# Patient Record
Sex: Male | Born: 1960 | Race: Black or African American | Hispanic: No | Marital: Single | Smoking: Former smoker
Health system: Southern US, Community
[De-identification: ages and names within clinical notes are randomized; demographics above are authoritative.]

## PROBLEM LIST (undated history)

## (undated) DIAGNOSIS — R112 Nausea with vomiting, unspecified: Secondary | ICD-10-CM

## (undated) DIAGNOSIS — R7303 Prediabetes: Secondary | ICD-10-CM

## (undated) DIAGNOSIS — T8859XA Other complications of anesthesia, initial encounter: Secondary | ICD-10-CM

## (undated) DIAGNOSIS — I10 Essential (primary) hypertension: Secondary | ICD-10-CM

## (undated) DIAGNOSIS — Z86718 Personal history of other venous thrombosis and embolism: Secondary | ICD-10-CM

## (undated) DIAGNOSIS — E785 Hyperlipidemia, unspecified: Secondary | ICD-10-CM

## (undated) DIAGNOSIS — Z9889 Other specified postprocedural states: Secondary | ICD-10-CM

## (undated) DIAGNOSIS — L309 Dermatitis, unspecified: Secondary | ICD-10-CM

## (undated) DIAGNOSIS — M1990 Unspecified osteoarthritis, unspecified site: Secondary | ICD-10-CM

## (undated) DIAGNOSIS — J45909 Unspecified asthma, uncomplicated: Secondary | ICD-10-CM

## (undated) DIAGNOSIS — H509 Unspecified strabismus: Secondary | ICD-10-CM

## (undated) DIAGNOSIS — T4145XA Adverse effect of unspecified anesthetic, initial encounter: Secondary | ICD-10-CM

## (undated) HISTORY — PX: LUMBAR DISC SURGERY: SHX700

---

## 2004-01-28 ENCOUNTER — Ambulatory Visit (HOSPITAL_COMMUNITY): Admission: RE | Admit: 2004-01-28 | Discharge: 2004-01-28 | Payer: Self-pay | Admitting: Internal Medicine

## 2004-02-03 ENCOUNTER — Ambulatory Visit (HOSPITAL_COMMUNITY): Admission: RE | Admit: 2004-02-03 | Discharge: 2004-02-03 | Payer: Self-pay | Admitting: Internal Medicine

## 2004-06-22 ENCOUNTER — Ambulatory Visit: Payer: Self-pay | Admitting: Family Medicine

## 2004-06-28 ENCOUNTER — Ambulatory Visit: Payer: Self-pay | Admitting: *Deleted

## 2004-06-28 ENCOUNTER — Ambulatory Visit: Payer: Self-pay | Admitting: Family Medicine

## 2004-09-14 ENCOUNTER — Ambulatory Visit: Payer: Self-pay | Admitting: Family Medicine

## 2004-09-17 ENCOUNTER — Ambulatory Visit (HOSPITAL_COMMUNITY): Admission: RE | Admit: 2004-09-17 | Discharge: 2004-09-17 | Payer: Self-pay | Admitting: Family Medicine

## 2004-09-17 ENCOUNTER — Ambulatory Visit: Payer: Self-pay | Admitting: Family Medicine

## 2004-09-22 ENCOUNTER — Ambulatory Visit: Payer: Self-pay | Admitting: Family Medicine

## 2005-01-17 ENCOUNTER — Ambulatory Visit (HOSPITAL_COMMUNITY): Admission: RE | Admit: 2005-01-17 | Discharge: 2005-01-17 | Payer: Self-pay | Admitting: Internal Medicine

## 2005-01-17 ENCOUNTER — Ambulatory Visit: Payer: Self-pay | Admitting: Family Medicine

## 2005-01-18 ENCOUNTER — Ambulatory Visit: Payer: Self-pay | Admitting: Family Medicine

## 2005-03-30 ENCOUNTER — Ambulatory Visit: Payer: Self-pay | Admitting: Family Medicine

## 2005-04-04 ENCOUNTER — Ambulatory Visit: Payer: Self-pay | Admitting: Internal Medicine

## 2005-07-27 ENCOUNTER — Emergency Department (HOSPITAL_COMMUNITY): Admission: EM | Admit: 2005-07-27 | Discharge: 2005-07-27 | Payer: Self-pay | Admitting: Emergency Medicine

## 2005-09-01 ENCOUNTER — Ambulatory Visit: Payer: Self-pay | Admitting: Family Medicine

## 2005-09-15 ENCOUNTER — Ambulatory Visit: Payer: Self-pay | Admitting: Family Medicine

## 2005-10-19 ENCOUNTER — Ambulatory Visit: Payer: Self-pay | Admitting: Family Medicine

## 2005-11-26 ENCOUNTER — Ambulatory Visit (HOSPITAL_COMMUNITY): Admission: RE | Admit: 2005-11-26 | Discharge: 2005-11-26 | Payer: Self-pay | Admitting: General Practice

## 2006-02-07 ENCOUNTER — Emergency Department (HOSPITAL_COMMUNITY): Admission: EM | Admit: 2006-02-07 | Discharge: 2006-02-07 | Payer: Self-pay | Admitting: *Deleted

## 2006-02-17 ENCOUNTER — Emergency Department (HOSPITAL_COMMUNITY): Admission: EM | Admit: 2006-02-17 | Discharge: 2006-02-17 | Payer: Self-pay | Admitting: Emergency Medicine

## 2006-02-21 ENCOUNTER — Encounter: Admission: RE | Admit: 2006-02-21 | Discharge: 2006-02-21 | Payer: Self-pay | Admitting: General Practice

## 2006-02-28 ENCOUNTER — Emergency Department (HOSPITAL_COMMUNITY): Admission: EM | Admit: 2006-02-28 | Discharge: 2006-02-28 | Payer: Self-pay | Admitting: Emergency Medicine

## 2006-06-16 ENCOUNTER — Emergency Department (HOSPITAL_COMMUNITY): Admission: EM | Admit: 2006-06-16 | Discharge: 2006-06-16 | Payer: Self-pay | Admitting: Emergency Medicine

## 2006-07-10 ENCOUNTER — Emergency Department (HOSPITAL_COMMUNITY): Admission: EM | Admit: 2006-07-10 | Discharge: 2006-07-10 | Payer: Self-pay | Admitting: Emergency Medicine

## 2006-08-02 ENCOUNTER — Emergency Department (HOSPITAL_COMMUNITY): Admission: EM | Admit: 2006-08-02 | Discharge: 2006-08-02 | Payer: Self-pay | Admitting: Emergency Medicine

## 2006-09-16 ENCOUNTER — Emergency Department (HOSPITAL_COMMUNITY): Admission: EM | Admit: 2006-09-16 | Discharge: 2006-09-16 | Payer: Self-pay | Admitting: Emergency Medicine

## 2006-11-02 ENCOUNTER — Emergency Department (HOSPITAL_COMMUNITY): Admission: EM | Admit: 2006-11-02 | Discharge: 2006-11-02 | Payer: Self-pay | Admitting: Emergency Medicine

## 2006-11-03 ENCOUNTER — Emergency Department (HOSPITAL_COMMUNITY): Admission: EM | Admit: 2006-11-03 | Discharge: 2006-11-03 | Payer: Self-pay | Admitting: Emergency Medicine

## 2006-12-04 ENCOUNTER — Emergency Department (HOSPITAL_COMMUNITY): Admission: EM | Admit: 2006-12-04 | Discharge: 2006-12-04 | Payer: Self-pay | Admitting: Emergency Medicine

## 2006-12-04 ENCOUNTER — Emergency Department (HOSPITAL_COMMUNITY): Admission: EM | Admit: 2006-12-04 | Discharge: 2006-12-04 | Payer: Self-pay | Admitting: Family Medicine

## 2006-12-08 ENCOUNTER — Emergency Department (HOSPITAL_COMMUNITY): Admission: EM | Admit: 2006-12-08 | Discharge: 2006-12-08 | Payer: Self-pay | Admitting: Emergency Medicine

## 2006-12-19 ENCOUNTER — Emergency Department (HOSPITAL_COMMUNITY): Admission: EM | Admit: 2006-12-19 | Discharge: 2006-12-19 | Payer: Self-pay | Admitting: Emergency Medicine

## 2007-01-02 ENCOUNTER — Emergency Department (HOSPITAL_COMMUNITY): Admission: EM | Admit: 2007-01-02 | Discharge: 2007-01-02 | Payer: Self-pay | Admitting: Emergency Medicine

## 2007-01-17 ENCOUNTER — Emergency Department (HOSPITAL_COMMUNITY): Admission: EM | Admit: 2007-01-17 | Discharge: 2007-01-17 | Payer: Self-pay | Admitting: Emergency Medicine

## 2007-05-01 ENCOUNTER — Encounter
Admission: RE | Admit: 2007-05-01 | Discharge: 2007-05-01 | Payer: Self-pay | Admitting: Physical Medicine and Rehabilitation

## 2007-05-07 ENCOUNTER — Emergency Department (HOSPITAL_COMMUNITY): Admission: EM | Admit: 2007-05-07 | Discharge: 2007-05-07 | Payer: Self-pay | Admitting: Emergency Medicine

## 2007-05-30 ENCOUNTER — Emergency Department (HOSPITAL_COMMUNITY): Admission: EM | Admit: 2007-05-30 | Discharge: 2007-05-30 | Payer: Self-pay | Admitting: Emergency Medicine

## 2007-06-13 ENCOUNTER — Emergency Department (HOSPITAL_COMMUNITY): Admission: EM | Admit: 2007-06-13 | Discharge: 2007-06-13 | Payer: Self-pay | Admitting: Emergency Medicine

## 2007-06-28 ENCOUNTER — Emergency Department (HOSPITAL_COMMUNITY): Admission: EM | Admit: 2007-06-28 | Discharge: 2007-06-28 | Payer: Self-pay | Admitting: Family Medicine

## 2007-06-29 DIAGNOSIS — M545 Low back pain, unspecified: Secondary | ICD-10-CM | POA: Insufficient documentation

## 2007-06-29 DIAGNOSIS — I10 Essential (primary) hypertension: Secondary | ICD-10-CM | POA: Insufficient documentation

## 2007-06-29 DIAGNOSIS — J329 Chronic sinusitis, unspecified: Secondary | ICD-10-CM | POA: Insufficient documentation

## 2007-07-30 ENCOUNTER — Ambulatory Visit: Payer: Self-pay | Admitting: Internal Medicine

## 2007-07-30 LAB — CONVERTED CEMR LAB
ALT: 21 units/L (ref 0–53)
AST: 19 units/L (ref 0–37)
Basophils Absolute: 0 10*3/uL (ref 0.0–0.1)
Basophils Relative: 1 % (ref 0–1)
CO2: 25 meq/L (ref 19–32)
Calcium: 9.2 mg/dL (ref 8.4–10.5)
Chloride: 104 meq/L (ref 96–112)
Creatinine, Ser: 1.09 mg/dL (ref 0.40–1.50)
Lymphocytes Relative: 31 % (ref 12–46)
MCHC: 32.9 g/dL (ref 30.0–36.0)
Neutro Abs: 5 10*3/uL (ref 1.7–7.7)
Neutrophils Relative %: 58 % (ref 43–77)
PSA: 1.23 ng/mL (ref 0.10–4.00)
Platelets: 311 10*3/uL (ref 150–400)
Potassium: 4.7 meq/L (ref 3.5–5.3)
RDW: 14.9 % (ref 11.5–15.5)
Sodium: 139 meq/L (ref 135–145)
Total CHOL/HDL Ratio: 4.2
Total Protein: 7.9 g/dL (ref 6.0–8.3)

## 2007-09-05 ENCOUNTER — Emergency Department (HOSPITAL_COMMUNITY): Admission: EM | Admit: 2007-09-05 | Discharge: 2007-09-05 | Payer: Self-pay | Admitting: Emergency Medicine

## 2007-12-06 ENCOUNTER — Ambulatory Visit: Payer: Self-pay | Admitting: Family Medicine

## 2007-12-25 ENCOUNTER — Emergency Department (HOSPITAL_COMMUNITY): Admission: EM | Admit: 2007-12-25 | Discharge: 2007-12-25 | Payer: Self-pay | Admitting: Emergency Medicine

## 2008-01-06 ENCOUNTER — Emergency Department (HOSPITAL_COMMUNITY): Admission: EM | Admit: 2008-01-06 | Discharge: 2008-01-06 | Payer: Self-pay | Admitting: Emergency Medicine

## 2008-01-23 ENCOUNTER — Emergency Department (HOSPITAL_COMMUNITY): Admission: EM | Admit: 2008-01-23 | Discharge: 2008-01-23 | Payer: Self-pay | Admitting: Emergency Medicine

## 2008-02-06 ENCOUNTER — Ambulatory Visit: Payer: Self-pay | Admitting: Internal Medicine

## 2008-02-20 ENCOUNTER — Emergency Department (HOSPITAL_COMMUNITY): Admission: EM | Admit: 2008-02-20 | Discharge: 2008-02-20 | Payer: Self-pay | Admitting: Emergency Medicine

## 2008-03-05 ENCOUNTER — Emergency Department (HOSPITAL_COMMUNITY): Admission: EM | Admit: 2008-03-05 | Discharge: 2008-03-05 | Payer: Self-pay | Admitting: Emergency Medicine

## 2008-05-20 ENCOUNTER — Inpatient Hospital Stay (HOSPITAL_COMMUNITY): Admission: RE | Admit: 2008-05-20 | Discharge: 2008-05-26 | Payer: Self-pay | Admitting: Orthopedic Surgery

## 2008-05-20 ENCOUNTER — Encounter (INDEPENDENT_AMBULATORY_CARE_PROVIDER_SITE_OTHER): Payer: Self-pay | Admitting: Orthopedic Surgery

## 2008-05-30 ENCOUNTER — Encounter (INDEPENDENT_AMBULATORY_CARE_PROVIDER_SITE_OTHER): Payer: Self-pay | Admitting: Orthopedic Surgery

## 2008-05-30 ENCOUNTER — Ambulatory Visit: Payer: Self-pay | Admitting: Vascular Surgery

## 2008-05-30 ENCOUNTER — Inpatient Hospital Stay (HOSPITAL_COMMUNITY): Admission: AD | Admit: 2008-05-30 | Discharge: 2008-06-09 | Payer: Self-pay | Admitting: Orthopedic Surgery

## 2008-05-30 DIAGNOSIS — Z86718 Personal history of other venous thrombosis and embolism: Secondary | ICD-10-CM

## 2008-05-30 HISTORY — DX: Personal history of other venous thrombosis and embolism: Z86.718

## 2008-06-02 ENCOUNTER — Ambulatory Visit: Payer: Self-pay | Admitting: Infectious Diseases

## 2008-06-03 ENCOUNTER — Encounter (INDEPENDENT_AMBULATORY_CARE_PROVIDER_SITE_OTHER): Payer: Self-pay | Admitting: Orthopedic Surgery

## 2008-07-15 ENCOUNTER — Emergency Department (HOSPITAL_COMMUNITY): Admission: EM | Admit: 2008-07-15 | Discharge: 2008-07-15 | Payer: Self-pay | Admitting: Emergency Medicine

## 2008-07-27 ENCOUNTER — Emergency Department (HOSPITAL_COMMUNITY): Admission: EM | Admit: 2008-07-27 | Discharge: 2008-07-27 | Payer: Self-pay | Admitting: Emergency Medicine

## 2008-08-15 ENCOUNTER — Emergency Department (HOSPITAL_COMMUNITY): Admission: EM | Admit: 2008-08-15 | Discharge: 2008-08-15 | Payer: Self-pay | Admitting: Family Medicine

## 2008-08-23 ENCOUNTER — Emergency Department (HOSPITAL_COMMUNITY): Admission: EM | Admit: 2008-08-23 | Discharge: 2008-08-23 | Payer: Self-pay | Admitting: Emergency Medicine

## 2008-09-02 ENCOUNTER — Emergency Department (HOSPITAL_COMMUNITY): Admission: EM | Admit: 2008-09-02 | Discharge: 2008-09-02 | Payer: Self-pay | Admitting: Emergency Medicine

## 2008-09-03 ENCOUNTER — Ambulatory Visit: Payer: Self-pay | Admitting: Internal Medicine

## 2008-09-03 ENCOUNTER — Encounter (INDEPENDENT_AMBULATORY_CARE_PROVIDER_SITE_OTHER): Payer: Self-pay | Admitting: Family Medicine

## 2008-09-03 LAB — CONVERTED CEMR LAB
ALT: 23 units/L (ref 0–53)
Albumin: 4.5 g/dL (ref 3.5–5.2)
Alkaline Phosphatase: 62 units/L (ref 39–117)
CO2: 22 meq/L (ref 19–32)
Cholesterol: 179 mg/dL (ref 0–200)
LDL Cholesterol: 111 mg/dL — ABNORMAL HIGH (ref 0–99)
Potassium: 4.4 meq/L (ref 3.5–5.3)
Sodium: 142 meq/L (ref 135–145)
Total Bilirubin: 0.4 mg/dL (ref 0.3–1.2)
Total Protein: 7.9 g/dL (ref 6.0–8.3)
VLDL: 28 mg/dL (ref 0–40)

## 2008-09-23 ENCOUNTER — Emergency Department (HOSPITAL_COMMUNITY): Admission: EM | Admit: 2008-09-23 | Discharge: 2008-09-23 | Payer: Self-pay | Admitting: Emergency Medicine

## 2008-10-07 ENCOUNTER — Emergency Department (HOSPITAL_COMMUNITY): Admission: EM | Admit: 2008-10-07 | Discharge: 2008-10-07 | Payer: Self-pay | Admitting: Emergency Medicine

## 2008-10-17 ENCOUNTER — Emergency Department (HOSPITAL_COMMUNITY): Admission: EM | Admit: 2008-10-17 | Discharge: 2008-10-18 | Payer: Self-pay | Admitting: Emergency Medicine

## 2008-11-01 ENCOUNTER — Emergency Department (HOSPITAL_COMMUNITY): Admission: EM | Admit: 2008-11-01 | Discharge: 2008-11-01 | Payer: Self-pay | Admitting: Emergency Medicine

## 2008-11-03 ENCOUNTER — Emergency Department (HOSPITAL_COMMUNITY): Admission: EM | Admit: 2008-11-03 | Discharge: 2008-11-03 | Payer: Self-pay | Admitting: Family Medicine

## 2008-11-16 ENCOUNTER — Emergency Department (HOSPITAL_COMMUNITY): Admission: EM | Admit: 2008-11-16 | Discharge: 2008-11-16 | Payer: Self-pay | Admitting: Emergency Medicine

## 2008-12-14 ENCOUNTER — Emergency Department (HOSPITAL_COMMUNITY): Admission: EM | Admit: 2008-12-14 | Discharge: 2008-12-14 | Payer: Self-pay | Admitting: Emergency Medicine

## 2008-12-28 ENCOUNTER — Emergency Department (HOSPITAL_COMMUNITY): Admission: EM | Admit: 2008-12-28 | Discharge: 2008-12-28 | Payer: Self-pay | Admitting: Emergency Medicine

## 2009-01-18 ENCOUNTER — Emergency Department (HOSPITAL_COMMUNITY): Admission: EM | Admit: 2009-01-18 | Discharge: 2009-01-19 | Payer: Self-pay | Admitting: Emergency Medicine

## 2009-01-21 ENCOUNTER — Emergency Department (HOSPITAL_COMMUNITY): Admission: EM | Admit: 2009-01-21 | Discharge: 2009-01-21 | Payer: Self-pay | Admitting: Emergency Medicine

## 2009-01-23 ENCOUNTER — Ambulatory Visit: Payer: Self-pay | Admitting: Internal Medicine

## 2009-02-07 ENCOUNTER — Emergency Department (HOSPITAL_COMMUNITY): Admission: EM | Admit: 2009-02-07 | Discharge: 2009-02-08 | Payer: Self-pay | Admitting: Emergency Medicine

## 2009-02-19 ENCOUNTER — Emergency Department (HOSPITAL_COMMUNITY): Admission: EM | Admit: 2009-02-19 | Discharge: 2009-02-19 | Payer: Self-pay | Admitting: Emergency Medicine

## 2009-02-26 ENCOUNTER — Emergency Department (HOSPITAL_COMMUNITY): Admission: EM | Admit: 2009-02-26 | Discharge: 2009-02-26 | Payer: Self-pay | Admitting: Emergency Medicine

## 2009-03-02 ENCOUNTER — Emergency Department (HOSPITAL_COMMUNITY): Admission: EM | Admit: 2009-03-02 | Discharge: 2009-03-02 | Payer: Self-pay | Admitting: Emergency Medicine

## 2009-03-31 ENCOUNTER — Emergency Department (HOSPITAL_COMMUNITY): Admission: EM | Admit: 2009-03-31 | Discharge: 2009-03-31 | Payer: Self-pay | Admitting: Emergency Medicine

## 2009-04-17 ENCOUNTER — Emergency Department (HOSPITAL_COMMUNITY): Admission: EM | Admit: 2009-04-17 | Discharge: 2009-04-18 | Payer: Self-pay | Admitting: Emergency Medicine

## 2009-05-25 ENCOUNTER — Encounter: Admission: RE | Admit: 2009-05-25 | Discharge: 2009-05-25 | Payer: Self-pay | Admitting: Family Medicine

## 2009-09-11 ENCOUNTER — Emergency Department (HOSPITAL_COMMUNITY): Admission: EM | Admit: 2009-09-11 | Discharge: 2009-09-11 | Payer: Self-pay | Admitting: Emergency Medicine

## 2009-09-26 ENCOUNTER — Emergency Department (HOSPITAL_COMMUNITY): Admission: EM | Admit: 2009-09-26 | Discharge: 2009-09-26 | Payer: Self-pay | Admitting: Emergency Medicine

## 2009-10-08 ENCOUNTER — Emergency Department (HOSPITAL_COMMUNITY): Admission: EM | Admit: 2009-10-08 | Discharge: 2009-10-08 | Payer: Self-pay | Admitting: Emergency Medicine

## 2009-10-20 ENCOUNTER — Emergency Department (HOSPITAL_COMMUNITY): Admission: EM | Admit: 2009-10-20 | Discharge: 2009-10-20 | Payer: Self-pay | Admitting: Emergency Medicine

## 2009-11-06 ENCOUNTER — Emergency Department (HOSPITAL_COMMUNITY): Admission: EM | Admit: 2009-11-06 | Discharge: 2009-11-06 | Payer: Self-pay | Admitting: Emergency Medicine

## 2009-11-18 ENCOUNTER — Emergency Department (HOSPITAL_COMMUNITY): Admission: EM | Admit: 2009-11-18 | Discharge: 2009-11-18 | Payer: Self-pay | Admitting: Emergency Medicine

## 2009-11-28 ENCOUNTER — Emergency Department (HOSPITAL_COMMUNITY): Admission: EM | Admit: 2009-11-28 | Discharge: 2009-11-28 | Payer: Self-pay | Admitting: Emergency Medicine

## 2009-11-29 ENCOUNTER — Emergency Department (HOSPITAL_COMMUNITY): Admission: EM | Admit: 2009-11-29 | Discharge: 2009-11-29 | Payer: Self-pay | Admitting: Emergency Medicine

## 2009-12-01 ENCOUNTER — Emergency Department (HOSPITAL_COMMUNITY): Admission: EM | Admit: 2009-12-01 | Discharge: 2009-12-01 | Payer: Self-pay | Admitting: Emergency Medicine

## 2010-01-10 ENCOUNTER — Emergency Department (HOSPITAL_COMMUNITY): Admission: EM | Admit: 2010-01-10 | Discharge: 2010-01-10 | Payer: Self-pay | Admitting: Emergency Medicine

## 2010-01-30 ENCOUNTER — Emergency Department (HOSPITAL_COMMUNITY): Admission: EM | Admit: 2010-01-30 | Discharge: 2010-01-30 | Payer: Self-pay | Admitting: Emergency Medicine

## 2010-04-28 ENCOUNTER — Emergency Department (HOSPITAL_COMMUNITY): Admission: EM | Admit: 2010-04-28 | Discharge: 2010-04-28 | Payer: Self-pay | Admitting: Emergency Medicine

## 2010-06-03 ENCOUNTER — Encounter
Admission: RE | Admit: 2010-06-03 | Discharge: 2010-06-07 | Payer: Self-pay | Source: Home / Self Care | Attending: Physical Medicine & Rehabilitation | Admitting: Physical Medicine & Rehabilitation

## 2010-06-07 ENCOUNTER — Ambulatory Visit: Payer: Self-pay | Admitting: Physical Medicine & Rehabilitation

## 2010-08-05 ENCOUNTER — Emergency Department (HOSPITAL_COMMUNITY): Admission: EM | Admit: 2010-08-05 | Discharge: 2010-01-31 | Payer: Self-pay | Admitting: Emergency Medicine

## 2010-08-05 ENCOUNTER — Emergency Department (HOSPITAL_COMMUNITY)
Admission: EM | Admit: 2010-08-05 | Discharge: 2010-08-05 | Payer: Self-pay | Source: Home / Self Care | Admitting: Emergency Medicine

## 2010-08-13 ENCOUNTER — Ambulatory Visit: Payer: Self-pay | Admitting: Physical Medicine & Rehabilitation

## 2010-10-18 ENCOUNTER — Ambulatory Visit: Payer: Self-pay | Admitting: Physical Medicine & Rehabilitation

## 2010-11-10 ENCOUNTER — Ambulatory Visit: Payer: Medicaid Other | Admitting: Physical Therapy

## 2010-11-13 ENCOUNTER — Emergency Department (INDEPENDENT_AMBULATORY_CARE_PROVIDER_SITE_OTHER): Payer: Medicaid Other

## 2010-11-13 ENCOUNTER — Emergency Department (HOSPITAL_BASED_OUTPATIENT_CLINIC_OR_DEPARTMENT_OTHER)
Admission: EM | Admit: 2010-11-13 | Discharge: 2010-11-13 | Disposition: A | Discharge status: Home / Self Care | Payer: Medicaid Other | Attending: Emergency Medicine | Admitting: Emergency Medicine

## 2010-11-13 DIAGNOSIS — Y92410 Unspecified street and highway as the place of occurrence of the external cause: Secondary | ICD-10-CM | POA: Insufficient documentation

## 2010-11-13 DIAGNOSIS — I10 Essential (primary) hypertension: Secondary | ICD-10-CM | POA: Insufficient documentation

## 2010-11-13 DIAGNOSIS — E785 Hyperlipidemia, unspecified: Secondary | ICD-10-CM | POA: Insufficient documentation

## 2010-11-13 DIAGNOSIS — J45909 Unspecified asthma, uncomplicated: Secondary | ICD-10-CM | POA: Insufficient documentation

## 2010-11-13 DIAGNOSIS — M545 Low back pain, unspecified: Secondary | ICD-10-CM

## 2010-11-13 DIAGNOSIS — S335XXA Sprain of ligaments of lumbar spine, initial encounter: Secondary | ICD-10-CM | POA: Insufficient documentation

## 2010-11-13 DIAGNOSIS — M542 Cervicalgia: Secondary | ICD-10-CM

## 2010-11-13 DIAGNOSIS — S139XXA Sprain of joints and ligaments of unspecified parts of neck, initial encounter: Secondary | ICD-10-CM | POA: Insufficient documentation

## 2010-11-14 ENCOUNTER — Emergency Department (HOSPITAL_COMMUNITY)
Admission: EM | Admit: 2010-11-14 | Discharge: 2010-11-14 | Disposition: A | Discharge status: Home / Self Care | Payer: Medicaid Other | Attending: Emergency Medicine | Admitting: Emergency Medicine

## 2010-11-14 DIAGNOSIS — M79609 Pain in unspecified limb: Secondary | ICD-10-CM | POA: Insufficient documentation

## 2010-11-14 DIAGNOSIS — I10 Essential (primary) hypertension: Secondary | ICD-10-CM | POA: Insufficient documentation

## 2010-11-14 DIAGNOSIS — J45909 Unspecified asthma, uncomplicated: Secondary | ICD-10-CM | POA: Insufficient documentation

## 2010-11-14 DIAGNOSIS — M542 Cervicalgia: Secondary | ICD-10-CM | POA: Insufficient documentation

## 2010-11-14 DIAGNOSIS — M549 Dorsalgia, unspecified: Secondary | ICD-10-CM | POA: Insufficient documentation

## 2010-11-14 DIAGNOSIS — E785 Hyperlipidemia, unspecified: Secondary | ICD-10-CM | POA: Insufficient documentation

## 2010-11-14 DIAGNOSIS — R209 Unspecified disturbances of skin sensation: Secondary | ICD-10-CM | POA: Insufficient documentation

## 2010-11-17 ENCOUNTER — Ambulatory Visit: Payer: Medicaid Other | Admitting: Physical Therapy

## 2010-11-22 ENCOUNTER — Ambulatory Visit: Payer: Medicaid Other | Admitting: Physical Medicine & Rehabilitation

## 2010-11-28 ENCOUNTER — Emergency Department (HOSPITAL_COMMUNITY)
Admission: EM | Admit: 2010-11-28 | Discharge: 2010-11-28 | Disposition: A | Discharge status: Home / Self Care | Payer: Medicaid Other | Attending: Emergency Medicine | Admitting: Emergency Medicine

## 2010-11-28 DIAGNOSIS — Z79899 Other long term (current) drug therapy: Secondary | ICD-10-CM | POA: Insufficient documentation

## 2010-11-28 DIAGNOSIS — E785 Hyperlipidemia, unspecified: Secondary | ICD-10-CM | POA: Insufficient documentation

## 2010-11-28 DIAGNOSIS — K089 Disorder of teeth and supporting structures, unspecified: Secondary | ICD-10-CM | POA: Insufficient documentation

## 2010-11-28 DIAGNOSIS — I10 Essential (primary) hypertension: Secondary | ICD-10-CM | POA: Insufficient documentation

## 2010-11-28 DIAGNOSIS — J45909 Unspecified asthma, uncomplicated: Secondary | ICD-10-CM | POA: Insufficient documentation

## 2010-11-29 ENCOUNTER — Other Ambulatory Visit: Payer: Self-pay | Admitting: Orthopaedic Surgery

## 2010-11-29 DIAGNOSIS — M542 Cervicalgia: Secondary | ICD-10-CM

## 2010-11-30 ENCOUNTER — Other Ambulatory Visit: Payer: Medicaid Other

## 2010-11-30 ENCOUNTER — Ambulatory Visit
Admission: RE | Admit: 2010-11-30 | Discharge: 2010-11-30 | Disposition: A | Discharge status: Home / Self Care | Payer: Medicaid Other | Source: Ambulatory Visit | Attending: Orthopaedic Surgery | Admitting: Orthopaedic Surgery

## 2010-11-30 DIAGNOSIS — M542 Cervicalgia: Secondary | ICD-10-CM

## 2010-12-03 ENCOUNTER — Other Ambulatory Visit: Payer: Medicaid Other

## 2010-12-04 LAB — CBC
HCT: 42.5 % (ref 39.0–52.0)
Hemoglobin: 14.3 g/dL (ref 13.0–17.0)
MCHC: 33.9 g/dL (ref 30.0–36.0)
Platelets: 295 10*3/uL (ref 150–400)
RBC: 4.64 MIL/uL (ref 4.22–5.81)
RDW: 16 % — ABNORMAL HIGH (ref 11.5–15.5)

## 2010-12-04 LAB — RAPID URINE DRUG SCREEN, HOSP PERFORMED
Amphetamines: NOT DETECTED
Barbiturates: NOT DETECTED
Benzodiazepines: NOT DETECTED

## 2010-12-04 LAB — BASIC METABOLIC PANEL
CO2: 30 mEq/L (ref 19–32)
Calcium: 9.4 mg/dL (ref 8.4–10.5)
Calcium: 9.3 mg/dL (ref 8.4–10.5)
Creatinine, Ser: 1.26 mg/dL (ref 0.4–1.5)
GFR calc Af Amer: >60 mL/min (ref >60)
GFR calc non Af Amer: >60 mL/min (ref >60)
Glucose, Bld: 109 mg/dL — ABNORMAL HIGH (ref 70–99)
Potassium: 4 mEq/L (ref 3.5–5.1)
Sodium: 136 mEq/L (ref 135–145)

## 2010-12-04 LAB — DIFFERENTIAL
Basophils Absolute: 0.1 10*3/uL (ref 0.0–0.1)
Basophils Relative: 1 % (ref 0–1)
Neutro Abs: 10.4 10*3/uL — ABNORMAL HIGH (ref 1.7–7.7)
Neutrophils Relative %: 72 % (ref 43–77)

## 2010-12-04 LAB — URINALYSIS, ROUTINE W REFLEX MICROSCOPIC
Bilirubin Urine: NEGATIVE
Nitrite: NEGATIVE
Specific Gravity, Urine: 1.025 (ref 1.005–1.030)
Urobilinogen, UA: 0.2 mg/dL (ref 0.0–1.0)

## 2010-12-04 LAB — GLUCOSE, CAPILLARY: Glucose-Capillary: 118 mg/dL — ABNORMAL HIGH (ref 70–99)

## 2010-12-06 LAB — COMPREHENSIVE METABOLIC PANEL
AST: 28 U/L (ref 0–37)
BUN: 15 mg/dL (ref 6–23)
CO2: 24 mEq/L (ref 19–32)
Chloride: 97 mEq/L (ref 96–112)
Creatinine, Ser: 1.37 mg/dL (ref 0.4–1.5)
GFR calc Af Amer: >60 mL/min (ref >60)
GFR calc non Af Amer: 56 mL/min — ABNORMAL LOW (ref >60)
Glucose, Bld: 120 mg/dL — ABNORMAL HIGH (ref 70–99)
Total Bilirubin: 0.9 mg/dL (ref 0.3–1.2)

## 2010-12-06 LAB — URINALYSIS, ROUTINE W REFLEX MICROSCOPIC
Bilirubin Urine: NEGATIVE
Glucose, UA: NEGATIVE mg/dL
Hgb urine dipstick: NEGATIVE
Specific Gravity, Urine: 1.02 (ref 1.005–1.030)
pH: 6.5 (ref 5.0–8.0)

## 2010-12-06 LAB — DIFFERENTIAL
Basophils Absolute: 0.2 10*3/uL — ABNORMAL HIGH (ref 0.0–0.1)
Lymphs Abs: 4 10*3/uL (ref 0.7–4.0)
Monocytes Relative: 7 % (ref 3–12)
Neutrophils Relative %: 66 % (ref 43–77)

## 2010-12-06 LAB — CBC
HCT: 45.1 % (ref 39.0–52.0)
Hemoglobin: 15.1 g/dL (ref 13.0–17.0)
MCV: 91.2 fL (ref 78.0–100.0)
RBC: 4.94 MIL/uL (ref 4.22–5.81)
WBC: 15.3 10*3/uL — ABNORMAL HIGH (ref 4.0–10.5)

## 2011-01-03 ENCOUNTER — Emergency Department (HOSPITAL_COMMUNITY)
Admission: EM | Admit: 2011-01-03 | Discharge: 2011-01-03 | Disposition: A | Discharge status: Home / Self Care | Payer: Medicaid Other | Attending: Emergency Medicine | Admitting: Emergency Medicine

## 2011-01-03 ENCOUNTER — Ambulatory Visit: Payer: Medicaid Other | Attending: Orthopaedic Surgery | Admitting: Physical Therapy

## 2011-01-03 ENCOUNTER — Emergency Department (HOSPITAL_COMMUNITY): Payer: Medicaid Other

## 2011-01-03 DIAGNOSIS — M533 Sacrococcygeal disorders, not elsewhere classified: Secondary | ICD-10-CM | POA: Insufficient documentation

## 2011-01-03 DIAGNOSIS — M542 Cervicalgia: Secondary | ICD-10-CM | POA: Insufficient documentation

## 2011-01-03 DIAGNOSIS — IMO0001 Reserved for inherently not codable concepts without codable children: Secondary | ICD-10-CM | POA: Insufficient documentation

## 2011-01-03 DIAGNOSIS — M545 Low back pain, unspecified: Secondary | ICD-10-CM | POA: Insufficient documentation

## 2011-01-03 DIAGNOSIS — M2560 Stiffness of unspecified joint, not elsewhere classified: Secondary | ICD-10-CM | POA: Insufficient documentation

## 2011-01-03 DIAGNOSIS — Z9889 Other specified postprocedural states: Secondary | ICD-10-CM | POA: Insufficient documentation

## 2011-01-03 DIAGNOSIS — J45909 Unspecified asthma, uncomplicated: Secondary | ICD-10-CM | POA: Insufficient documentation

## 2011-01-03 DIAGNOSIS — I10 Essential (primary) hypertension: Secondary | ICD-10-CM | POA: Insufficient documentation

## 2011-01-03 DIAGNOSIS — E785 Hyperlipidemia, unspecified: Secondary | ICD-10-CM | POA: Insufficient documentation

## 2011-01-03 DIAGNOSIS — M25519 Pain in unspecified shoulder: Secondary | ICD-10-CM | POA: Insufficient documentation

## 2011-01-10 ENCOUNTER — Encounter: Payer: Medicaid Other | Admitting: Physical Therapy

## 2011-01-11 NOTE — Discharge Summary (Signed)
NAMEGEVORG, BRUM NO.:  [id number]   MEDICAL RECORD NO.:  [id number]          PATIENT TYPE:  INP   LOCATION:  5015                         FACILITY:  [facility]   PHYSICIAN:  Carl Severe, MD      DATE OF BIRTH:  [birth year]   DATE OF ADMISSION:  05/30/2008  DATE OF DISCHARGE:  06/09/2008                               DISCHARGE SUMMARY   Carl Orozco was admitted to [facility] on May 30, 2008,  secondary to increased lower extremity swelling, incisional pain status  post L5-S1 disk replacement on May 12, 2008.  He has had  significant swelling in his left calf, ankle, and foot, increased pain  left gluteal anterior groin and thigh pain since postsurgery.  He has  been on Coumadin since his surgery.  He has maintained INR acceptable  between 2.0 and 3.0 on 3 mg daily.  On admission, we did get a CT of  pelvis with IV contrast to rule out DVT versus hematoma buildup, status  post anterior disk at L5-S1.  We also ordered Dopplers to rule out  superficial DVT of left lower extremity.  The studies came back  negative, although the left iliac could not be visualized secondary to  implant and hematoma.  We will also do labs to include a CBC with  differential to rule out any infection, started him on IV Cipro.  We did  have a culture of the anterior incisions.  White count was 9.6.  Culture  was positive for Citrobacter koseri.  Infectious Disease was consulted  on June 02, 2008.  He was continued on Cipro IV.  The leg swelling  seemed to subside somewhat.  We did order another CT with contrast which  was not successful in visualizing left iliac as well.  We are treating  him as if he has iliac DVT by continued Coumadin to maintain between 2.0  and 3.0.  He is going to continue on Cipro 400 mg IV q. 12.  We are  going to have him wear knee-high TED hose with physical therapy helping  with ambulation and mobility.  Out of bed as tolerated.  Head of bed  as  tolerated.  The patient's white count was elevated at 12.8.  He is on  continued Cipro with ID input.  He has continued to have decreased  swelling of the left lower extremity on June 05, 2008.  On  examination, there is no swelling in the calf, ankle, or foot.  Sensation to light touch is grossly intact and equal.  Palpable DP  pulses were intact and equal.  Incision, no active drainage.  No  erythema.  No abnormal warmth.  We did order labs to include ESR and  CRP.  ESR was 54 which is elevated and CRP was elevated at 1.9.  White  count was lowered on June 06, 2008, to 9.8, continued Cipro IV.  We  hep-locked his fluids as this point, continue with ambulation.  Calves  are soft and nontender on the right, slight tenderness on the left,  abnormal edema as well today.  The  patient is going to be sent home.  We  will have him to walk for exercise.  No dressing is needed on his  incision.  It is completely healed at this point.  There is no drainage.  No abnormal erythema.  His white count is 9.0.  His INR is 2.7 on 3 mg  p.o. of Coumadin.  Calves are soft.  No edema is present to the  bilateral lower extremities, palpable DPs.  He still complains of left  lower extremity pain in the buttock, groin, and anterior thigh.  He is  going to be sent home with Cipro for 7 days and he is going to follow up  in our office in approximately 10 days The Cipro dose is 750 every 8  hours, Coumadin 3 mg p.o. daily with PT/INR draws biweekly for dosing  purposes, Lyrica 100 mg q.8, Norco 10/325 one to two q.4 p.r.n. for pain  control.  Wear TED hose during the day, not at night for sleep.  Elevate  the feet when at rest.   DIET:  Regular.   DISPOSITION:  Stable and call the office for an appointment.   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      Lianne Cure, P.A.      Carl Severe, MD  Electronically Signed     MC/MEDQ  D:  07/23/2008  T:  07/24/2008  Job:  [id number]

## 2011-01-11 NOTE — Op Note (Signed)
NAMEMICHAIAH, HOLSOPPLE NO.:  [id number]   MEDICAL RECORD NO.:  [id number]          PATIENT TYPE:  INP   LOCATION:  3041                         FACILITY:  [facility]   PHYSICIAN:  Nelda Severe, MD      DATE OF BIRTH:  01/21/1961   DATE OF PROCEDURE:  05/20/2008  DATE OF DISCHARGE:                               OPERATIVE REPORT   SURGEON:  Nelda Severe, MD   ASSISTANT:  Lianne Cure, PA   PREOPERATIVE DIAGNOSIS:  Central L5-S1 disk herniation with internal  disk disruption and disk degeneration, L4-5 disk annular tear.   POSTPROCEDURE DIAGNOSIS:  Central L5-S1 disk herniation with internal  disk disruption and disk degeneration, L4-5 disk annular tear.   OPERATIVE PROCEDURE:  Total disk replacement L5-S1, L4-5 disk  replacement not performed secondary to avulsion of iliolumbar vein  ligature complicated by potentially serious bleeding.   PROCEDURE NOTE:  The patient was placed under general endotracheal  anesthesia.  A central line had been placed by the anesthesia team  preoperatively.  Intravenous antibiotics were infused.  A Foley catheter  was placed in the bladder.  A pulse oximeter was placed on the left  great toe.   The patient was positioned supine on a radiolucent Jean Rosenthal table.  The  hips and knees were gently flexed.  The pubic hair was clipped down to  the level of the symphysis pubis.  The abdomen was prepped with DuraPrep  and draped in sterile fashion.  The drapes were secured with Ioban.   Incision was a vertical left lower abdominal paramedian incision.  Dissection was carried down to the rectus sheath, which was cut in line  with the incision.  The medial rectus was mobilized and retracted  laterally.  The arcuate line was easily identifiable.  The preperitoneal  flap was bluntly dissected and the dissection taken into the  retroperitoneal space to mobilize the peritoneal sac to the right side.  We incised very carefully the posterior  rectus sheath proximally for  about 4 cm and mobilized off the peritoneum.  This facilitated further  mobilization of the peritoneal sac to the right side.   The sacral promontory was identified.  Blunt dissection was used to  mobilize the sympathetic nerves off the common iliac vessels starting  from the left-sided and mobilizing into the right.  There were 2 median  sacral veins, one quite large.  The smaller was bipolar coagulated.  The  larger was ligated and divided.  Brau retractors were then placed  bilaterally to retract the right and left common iliac veins to expose  the anterior L5-S1 disk.  Retractors were also placed above and below.  Cross-table lateral fluoroscopic image was taken with a screw placed in  the annulus to confirm the correct level, and an AP fluoroscopic view  was also taken.  In that, the screw was displaced too far to the right  side.  Based on the AP fluoroscopic view, the midline was estimated to  be about 8 mm to the left where the screw was placed.  The screw was  then removed.  Almost total  diskectomy was then performed at L5-S1 in  the usual fashion.  The annulus was fenestrated in a rectangular fashion  and then the disk mobilized off the endplates above and below and  removed.  Intradiskal distractors were placed and annulus removed back  to the posterior longitudinal ligament, most of which was curetted off  its attachments into the vertebral body above and below.  When the  endplates had been satisfactorily exposed and prepared, a trial Prodisc  block was placed in the L5-S1 disk space.  It was quite hard to get it  to stably sit in the center on the AP fluoroscopic view.  Ultimately, we  used a seating chisel, which was used to create the slots into which the  disk is inserted to hold the block more centrally.  The slots were  created for the disk replacement, a large footprint, minimal thickness  replacement and the endplates then impacted into  place under direct  fluoroscopic guidance.  We then inserted the polyethylene bearing  surface and checked to make sure that there was no gap and that there  was no step between the inferior endplate of the disk replacement and  the polyethylene insert.  This is to assure that has been seated and  snapped into place.  We then took AP and lateral fluoroscopic views and  the disk replacement appeared well positioned.   I then removed the retractors and inspected for any bleeding and there  was really nothing, but some ooze.   I then began the process of mobilizing the left common iliac vessels.  A  moderately large iliolumbar vein was identified.  Its position was quite  anterior on the common iliac vein and it was about 15 mm distal to the  inferior portion of the L4-5 disk.  The vessel was triply ligated and a  vascular clip placed on it.  It was then divided.  I then proceeded to  start to mobilize the vein and it was apparent that the the stump of  vein either pulled off or the ligatures were pulled off because there  was very significant rapid bleeding, about 50 mL in literally less than  5 seconds.  Direct pressure was placed on the common iliac vein using a  lap sponge.  We then carefully exposed the area of the origin of the  common iliac vein and placed Gelfoam and Avitene into the area and  controlled the bleeding that way.  This worked very satisfactory and the  total amount of blood loss during the process of control on this  bleeding was probably less than 200 mL total.  I then removed the lap  sponge, which had been used to control the bleeding with direct pressure  and there was no more bleeding.  I observed the area for approximately  10 minutes and appeared stable.  I decided that I would not proceed with  further  immobilization of the vein.  The L4-5 disk had a small annular  tear but was much less abnormal than the L5-S1 disk where we had  successfully placed the disk  replacement.  I did not think further  bleeding should be risked to attempt to replace the disk at L4-5,  especially in view of the the fact that the L5-S1 disc was most probably  the primary pain generator.   Prior to closing the I performed the routine inspection of the left  ureter which was intact and of the left common iliac  artery an the left  common iliac vein proximally, bothe of which were intact.   The anterior rectus sheath was then closed using continuous 0 Vicryl  suture reinforced with approximately 4 interrupted horizontal mattress  sutures through the rectus sheath.  The subcutaneous layer was closed  using interrupted 2-0 inverted Vicryl sutures.  The skin was closed  using subcuticular 3-0 undyed Vicryl in running fashion.  Skin edge were  reinforced with Dermabond.   The sponge and needle counts were correct.  An abdominal x-ray was taken  and did not reveal any retained instruments etc.  The total blood loss  estimated in the range of 250-300 mL.  The patient was taken off the  table and awakened, and returned to the recovery in satisfactory  condition.  He was able to dorsiflex and plantar flex both feet and  ankles.   I did speak with his significant other postoperatively and explained to  her that we had not replaced the disk at L4-5 and the reasons why.      Nelda Severe, MD  Electronically Signed     MT/MEDQ  D:  05/20/2008  T:  05/21/2008  Job:  [id number]

## 2011-01-11 NOTE — Discharge Summary (Signed)
NAMESIRE, POET NO.:  [id number]   MEDICAL RECORD NO.:  [id number]           PATIENT TYPE:   LOCATION:                                 FACILITY:   PHYSICIAN:  Nelda Severe, MD      DATE OF BIRTH:  March 12, 1961   DATE OF ADMISSION:  05/20/2008  DATE OF DISCHARGE:  05/26/2008                               DISCHARGE SUMMARY   DIAGNOSIS:  L5-S1 herniated disk, central.   BRIEF HISTORY:  The patient was admitted to [facility] on  May 20, 2008.   PROCEDURE:  L5-S1 disk replacement by Dr. Nelda Severe (22 Sept., 2009)   Postoperatively, the patient was stable.  Complication, avulsed  iliolumbar lumbar vein ligated.  Blood loss 300 mL.  Grossly  neurovascularly motor intact.  He was admitted to 72.  On postop day  #1, the patient was stable.  He was placed on Coumadin 10 mg.  A vitamin  D level was drawn on May 26, 2008, which is slightly low.  He was  grossly neurovascularly motor intact.  He had no bowel sounds, on clear  liquid diet.  Physical therapy did an evaluation on postop day #1 on  May 21, 2008.  Home health planning was started for PT/INR blood  work for a total of 6 weeks postoperatively for dosing of Coumadin.  Hemoglobin was stable at 11.0, white count 11.8.  Incision clean and  dry.  No active drainage.  We discontinued his Foley and ordered some  Reglan 10 mg IV q.8 to help with bowel motility.  We also ordered stool  softeners, laxatives of choice, enema choice if necessary.  By postop  day #3, the patient was still having difficulties with mobility  secondary to pain in his abdomen.  He stated his back gets felt somewhat  better than prior to surgery, has been afebrile, and stable.  He has had  no BM at this point.  On postop day #4, the patient reports increased  bowel motility with positive flatulence.  We advanced his diet to  regular.  He has increased his activity.  He is independent in his room  with rolling  walker.  We discontinued the IV access in his neck and  gained a new access in his right upper extremity.  We discontinued the  PCA on May 25, 2008.  He was afebrile, vital signs were stable,  his INR was 3.1.  Dosing of Coumadin is 3 mg.  The patient is  comfortable.  At this point, he had some necrosis drainage from his  wound and ABD was placed.  There was minimal drainage on ABD.   DIAGNOSIS:  Central herniated disk L5-S1 with internal disk disruption.   Disposition is stable.   Diet is regular.   PLAN:  He will be discharged home under the care of a friend.  We were  going to have PT/INR blood work done twice weekly for 5 weeks starting  this Coumadin 3 mg.  We have recommended calcium 1500 mg daily along  with  vitamin D [phone] units daily and he has  Norco tabs at home and he can  take 1-2 q.4 p.r.n. pain control.  He is going to follow up in our  office in approximately 3 weeks.  We encouraged walking for exercise and  activity.  He may shower, dry dressing until no drainage.      Lianne Cure, P.A.      Nelda Severe, MD  Electronically Signed    MC/MEDQ  D:  05/26/2008  T:  05/26/2008  Job:  [id number]

## 2011-01-12 ENCOUNTER — Ambulatory Visit: Payer: Medicaid Other | Admitting: Physical Therapy

## 2011-01-13 ENCOUNTER — Ambulatory Visit: Payer: Medicaid Other | Admitting: Physical Therapy

## 2011-01-17 ENCOUNTER — Encounter: Payer: Medicaid Other | Admitting: Physical Therapy

## 2011-01-21 ENCOUNTER — Ambulatory Visit: Payer: Medicaid Other | Admitting: Physical Therapy

## 2011-01-25 ENCOUNTER — Encounter: Payer: Medicaid Other | Admitting: Physical Therapy

## 2011-01-26 ENCOUNTER — Ambulatory Visit: Payer: Medicaid Other | Admitting: Physical Therapy

## 2011-03-04 ENCOUNTER — Emergency Department (HOSPITAL_COMMUNITY)
Admission: EM | Admit: 2011-03-04 | Discharge: 2011-03-04 | Disposition: A | Discharge status: Home / Self Care | Payer: Medicaid Other | Attending: Emergency Medicine | Admitting: Emergency Medicine

## 2011-03-04 DIAGNOSIS — E785 Hyperlipidemia, unspecified: Secondary | ICD-10-CM | POA: Insufficient documentation

## 2011-03-04 DIAGNOSIS — I10 Essential (primary) hypertension: Secondary | ICD-10-CM | POA: Insufficient documentation

## 2011-03-04 DIAGNOSIS — J45909 Unspecified asthma, uncomplicated: Secondary | ICD-10-CM | POA: Insufficient documentation

## 2011-03-04 DIAGNOSIS — L600 Ingrowing nail: Secondary | ICD-10-CM | POA: Insufficient documentation

## 2011-03-04 DIAGNOSIS — Z79899 Other long term (current) drug therapy: Secondary | ICD-10-CM | POA: Insufficient documentation

## 2011-03-04 DIAGNOSIS — M79609 Pain in unspecified limb: Secondary | ICD-10-CM | POA: Insufficient documentation

## 2011-03-06 ENCOUNTER — Emergency Department (HOSPITAL_COMMUNITY)
Admission: EM | Admit: 2011-03-06 | Discharge: 2011-03-06 | Disposition: A | Discharge status: Home / Self Care | Payer: Medicaid Other | Attending: Emergency Medicine | Admitting: Emergency Medicine

## 2011-03-06 DIAGNOSIS — E785 Hyperlipidemia, unspecified: Secondary | ICD-10-CM | POA: Insufficient documentation

## 2011-03-06 DIAGNOSIS — I10 Essential (primary) hypertension: Secondary | ICD-10-CM | POA: Insufficient documentation

## 2011-03-06 DIAGNOSIS — J45909 Unspecified asthma, uncomplicated: Secondary | ICD-10-CM | POA: Insufficient documentation

## 2011-03-06 DIAGNOSIS — L600 Ingrowing nail: Secondary | ICD-10-CM | POA: Insufficient documentation

## 2011-05-25 LAB — CBC
MCHC: 34.9
MCV: 90.1
Platelets: 266
WBC: 11.1 — ABNORMAL HIGH

## 2011-05-25 LAB — URINALYSIS, ROUTINE W REFLEX MICROSCOPIC
Glucose, UA: NEGATIVE
Hgb urine dipstick: NEGATIVE
Ketones, ur: 15 — AB
Leukocytes, UA: NEGATIVE
Nitrite: NEGATIVE
Protein, ur: 30 — AB
Specific Gravity, Urine: 1.035 — ABNORMAL HIGH
Urobilinogen, UA: 0.2
pH: 5.5

## 2011-05-25 LAB — POCT I-STAT, CHEM 8
BUN: 21
Calcium, Ion: 1.21
Chloride: 104
Creatinine, Ser: 1.7 — ABNORMAL HIGH
Glucose, Bld: 104 — ABNORMAL HIGH
HCT: 50
Hemoglobin: 17
Potassium: 4.2
Sodium: 138
TCO2: 26

## 2011-05-25 LAB — DIFFERENTIAL
Basophils Relative: 0
Eosinophils Absolute: 0
Neutrophils Relative %: 61

## 2011-05-25 LAB — URINE MICROSCOPIC-ADD ON

## 2011-05-30 LAB — PROTIME-INR
INR: 0.9
INR: 2.2 — ABNORMAL HIGH
INR: 2.4 — ABNORMAL HIGH
INR: 3.6 — ABNORMAL HIGH
INR: 2.2 — ABNORMAL HIGH
INR: 2.4 — ABNORMAL HIGH
INR: 2.6 — ABNORMAL HIGH
INR: 2.7 — ABNORMAL HIGH
INR: 2.7 — ABNORMAL HIGH
INR: 2.8 — ABNORMAL HIGH
INR: 3 — ABNORMAL HIGH
Prothrombin Time: 12.6
Prothrombin Time: 37.5 — ABNORMAL HIGH
Prothrombin Time: 39.3 — ABNORMAL HIGH
Prothrombin Time: 25.5 — ABNORMAL HIGH
Prothrombin Time: 28.1 — ABNORMAL HIGH
Prothrombin Time: 28.2 — ABNORMAL HIGH
Prothrombin Time: 30 — ABNORMAL HIGH
Prothrombin Time: 30.3 — ABNORMAL HIGH
Prothrombin Time: 31.1 — ABNORMAL HIGH
Prothrombin Time: 33.8 — ABNORMAL HIGH
Prothrombin Time: 35.9 — ABNORMAL HIGH

## 2011-05-30 LAB — CBC
HCT: 31.3 — ABNORMAL LOW
HCT: 31.5 — ABNORMAL LOW
HCT: 31.7 — ABNORMAL LOW
HCT: 32.1 — ABNORMAL LOW
HCT: 32.3 — ABNORMAL LOW
Hemoglobin: 11 — ABNORMAL LOW
Hemoglobin: 10.3 — ABNORMAL LOW
Hemoglobin: 10.4 — ABNORMAL LOW
Hemoglobin: 10.5 — ABNORMAL LOW
Hemoglobin: 10.6 — ABNORMAL LOW
Hemoglobin: 10.6 — ABNORMAL LOW
Hemoglobin: 10.6 — ABNORMAL LOW
Hemoglobin: 11.3 — ABNORMAL LOW
MCHC: 33
MCHC: 33.4
MCHC: 33.3
MCHC: 33.5
MCHC: 33.6
MCHC: 33.6
MCHC: 33.7
MCHC: 33.7
MCHC: 33.8
MCV: 93
MCV: 90.1
MCV: 91.7
MCV: 92
MCV: 92
Platelets: 204
Platelets: 271
Platelets: 517 — ABNORMAL HIGH
Platelets: 519 — ABNORMAL HIGH
Platelets: 521 — ABNORMAL HIGH
Platelets: 553 — ABNORMAL HIGH
Platelets: 565 — ABNORMAL HIGH
RBC: 3.55 — ABNORMAL LOW
RBC: 5.14
RBC: 3.3 — ABNORMAL LOW
RBC: 3.38 — ABNORMAL LOW
RBC: 3.4 — ABNORMAL LOW
RBC: 3.43 — ABNORMAL LOW
RBC: 3.46 — ABNORMAL LOW
RDW: 14.2
RDW: 14.2
RDW: 13.5
RDW: 13.5
RDW: 13.5
RDW: 13.6
RDW: 13.7
RDW: 13.9
WBC: 10.7 — ABNORMAL HIGH
WBC: 10.9 — ABNORMAL HIGH
WBC: 9.6
WBC: 9.6

## 2011-05-30 LAB — DIFFERENTIAL
Basophils Absolute: 0
Basophils Absolute: 0
Basophils Absolute: 0
Basophils Absolute: 0
Basophils Absolute: 0.1
Basophils Absolute: 0.1
Basophils Absolute: 0.1
Basophils Absolute: 0.1
Basophils Absolute: 0.2 — ABNORMAL HIGH
Basophils Relative: 0
Basophils Relative: 0
Basophils Relative: 0
Basophils Relative: 1
Basophils Relative: 1
Basophils Relative: 1
Basophils Relative: 1
Basophils Relative: 1
Eosinophils Absolute: 0.4
Eosinophils Absolute: 0.4
Eosinophils Absolute: 0.5
Eosinophils Absolute: 0.5
Eosinophils Absolute: 0.5
Eosinophils Relative: 5
Eosinophils Relative: 4
Eosinophils Relative: 4
Eosinophils Relative: 5
Eosinophils Relative: 6 — ABNORMAL HIGH
Eosinophils Relative: 7 — ABNORMAL HIGH
Lymphocytes Relative: 29
Lymphocytes Relative: 23
Lymphocytes Relative: 23
Lymphocytes Relative: 31
Lymphs Abs: 2.8
Lymphs Abs: 2.2
Lymphs Abs: 2.4
Lymphs Abs: 2.5
Monocytes Absolute: 0.8
Monocytes Absolute: 0.9
Monocytes Absolute: 0.9
Monocytes Absolute: 1
Monocytes Absolute: 1
Monocytes Relative: 10
Monocytes Relative: 11
Monocytes Relative: 7
Monocytes Relative: 8
Monocytes Relative: 9
Monocytes Relative: 9
Neutro Abs: 5.5
Neutro Abs: 4.5
Neutro Abs: 5.1
Neutro Abs: 5.3
Neutro Abs: 5.3
Neutro Abs: 5.6
Neutro Abs: 6.7
Neutro Abs: 6.9
Neutro Abs: 7.6
Neutro Abs: 9.2 — ABNORMAL HIGH
Neutrophils Relative %: 51
Neutrophils Relative %: 56
Neutrophils Relative %: 58
Neutrophils Relative %: 58
Neutrophils Relative %: 62
Neutrophils Relative %: 65
Neutrophils Relative %: 72

## 2011-05-30 LAB — COMPREHENSIVE METABOLIC PANEL
AST: 31
Alkaline Phosphatase: 49
BUN: 6
CO2: 28
CO2: 31
Calcium: 9.1
Creatinine, Ser: 1.07
GFR calc Af Amer: >60
GFR calc non Af Amer: >60
GFR calc non Af Amer: >60
Glucose, Bld: 88
Potassium: 4.1
Total Bilirubin: 0.5
Total Protein: 6.4

## 2011-05-30 LAB — URINALYSIS, ROUTINE W REFLEX MICROSCOPIC
Glucose, UA: NEGATIVE
Ketones, ur: NEGATIVE
Nitrite: NEGATIVE
Nitrite: NEGATIVE
Protein, ur: NEGATIVE
Specific Gravity, Urine: 1.023
Urobilinogen, UA: 0.2
pH: 6

## 2011-05-30 LAB — TYPE AND SCREEN

## 2011-05-30 LAB — VITAMIN D 25 HYDROXY (VIT D DEFICIENCY, FRACTURES): Vit D, 25-Hydroxy: 28 — ABNORMAL LOW (ref 30–89)

## 2011-05-30 LAB — BASIC METABOLIC PANEL
CO2: 28
Calcium: 7.9 — ABNORMAL LOW
Calcium: 8.5
Creatinine, Ser: 1.09
GFR calc Af Amer: >60
GFR calc Af Amer: >60
GFR calc non Af Amer: 55 — ABNORMAL LOW
Glucose, Bld: 92
Sodium: 135

## 2011-05-30 LAB — APTT
aPTT: 31
aPTT: 103 — ABNORMAL HIGH

## 2011-05-30 LAB — POCT I-STAT 4, (NA,K, GLUC, HGB,HCT)
HCT: 37 — ABNORMAL LOW
Hemoglobin: 12.6 — ABNORMAL LOW
Potassium: 4.1

## 2011-05-30 LAB — WOUND CULTURE

## 2011-06-08 LAB — POCT URINALYSIS DIP (DEVICE)
Operator id: 239701
Protein, ur: NEGATIVE
Urobilinogen, UA: 0.2

## 2011-07-20 IMAGING — CR DG LUMBAR SPINE COMPLETE 4+V
5 series · 5 of 5 positions shown · non-contrast
Comparison: 11/13/2010

CLINICAL DATA: Fell - back pain.  History of spine surgery

LUMBAR SPINE - COMPLETE 4+ VIEW

[t l-spine a.p.]
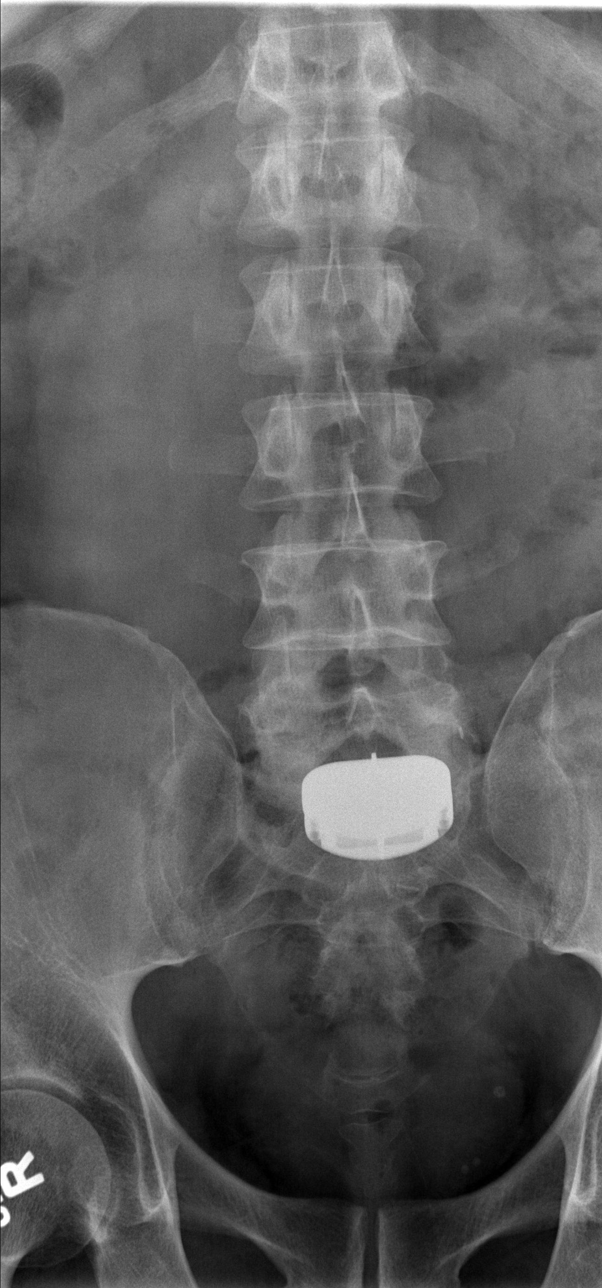

[t l-spine oblique exposure (1 of 2)]
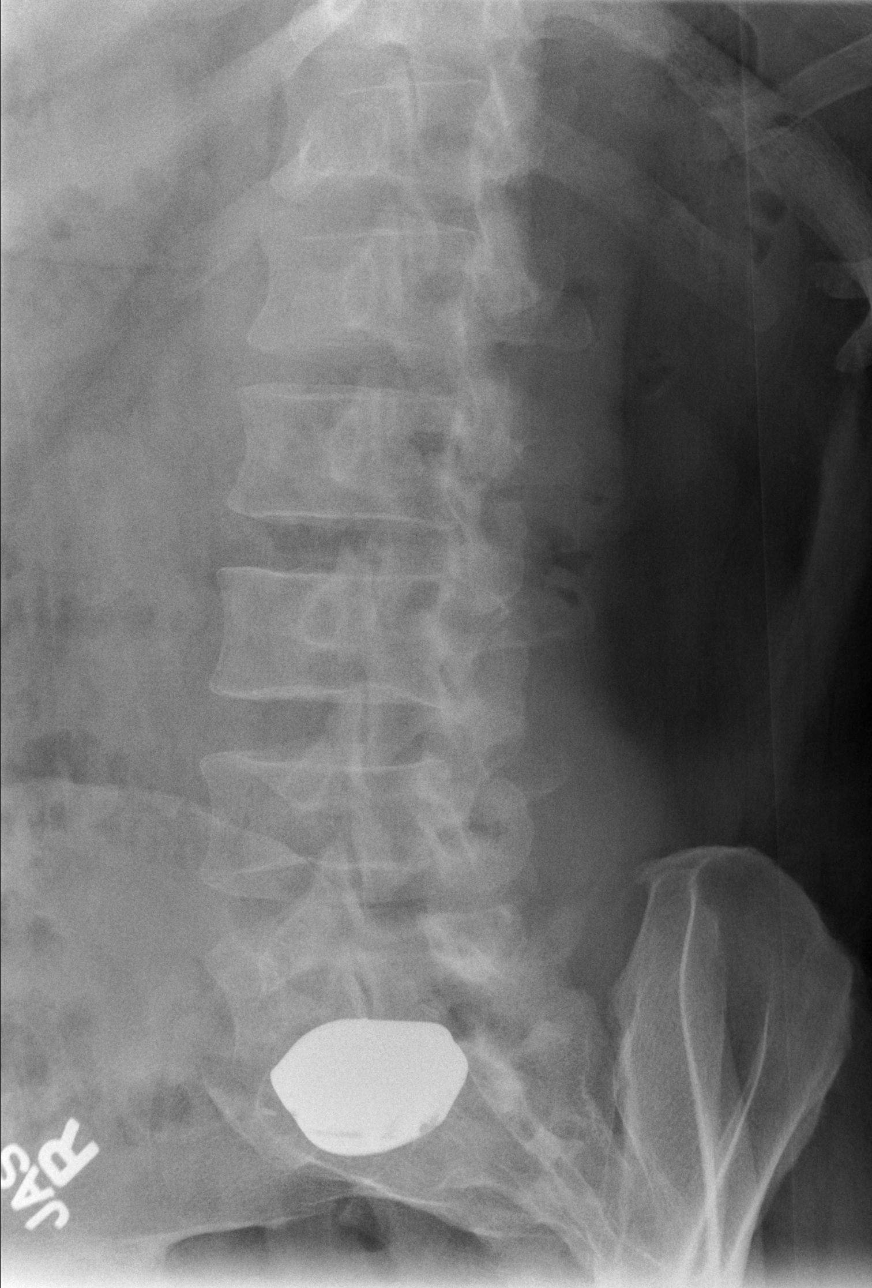

[t l-spine oblique exposure (2 of 2)]
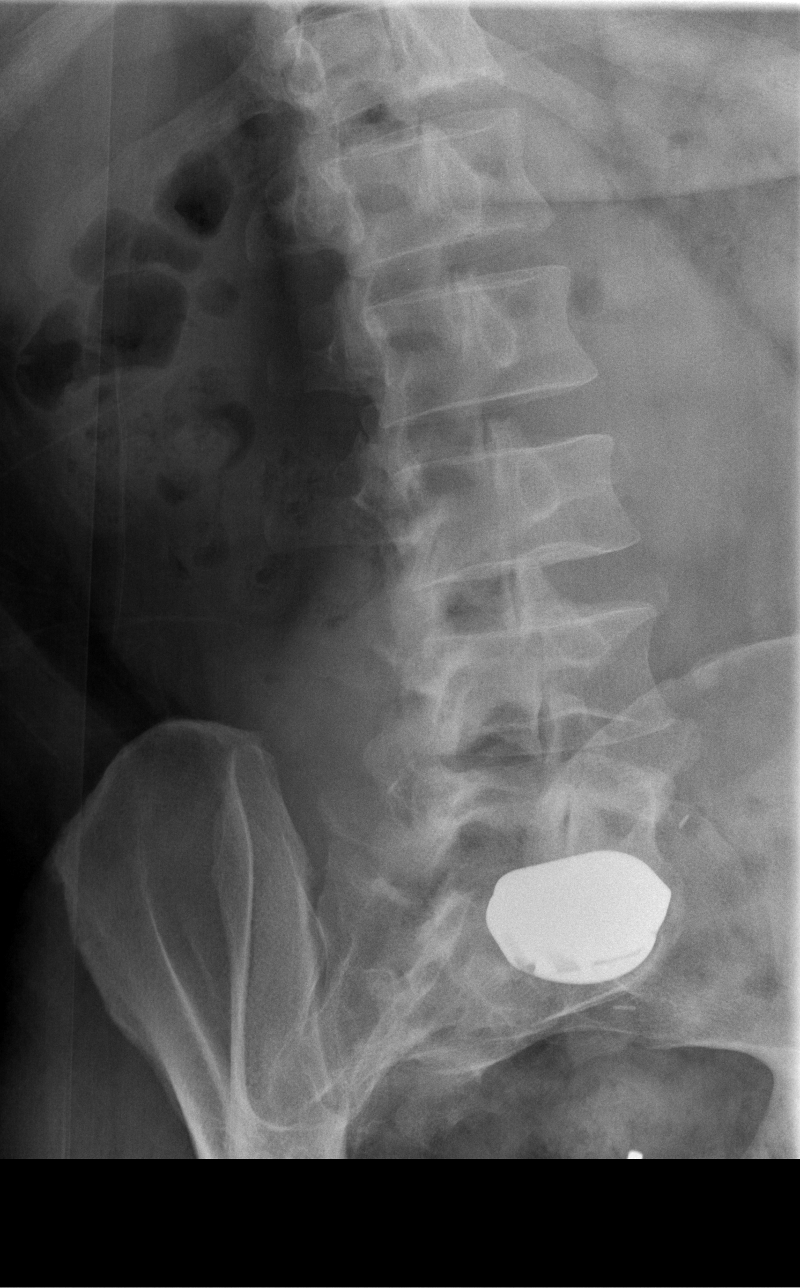

[t l-spine lat]
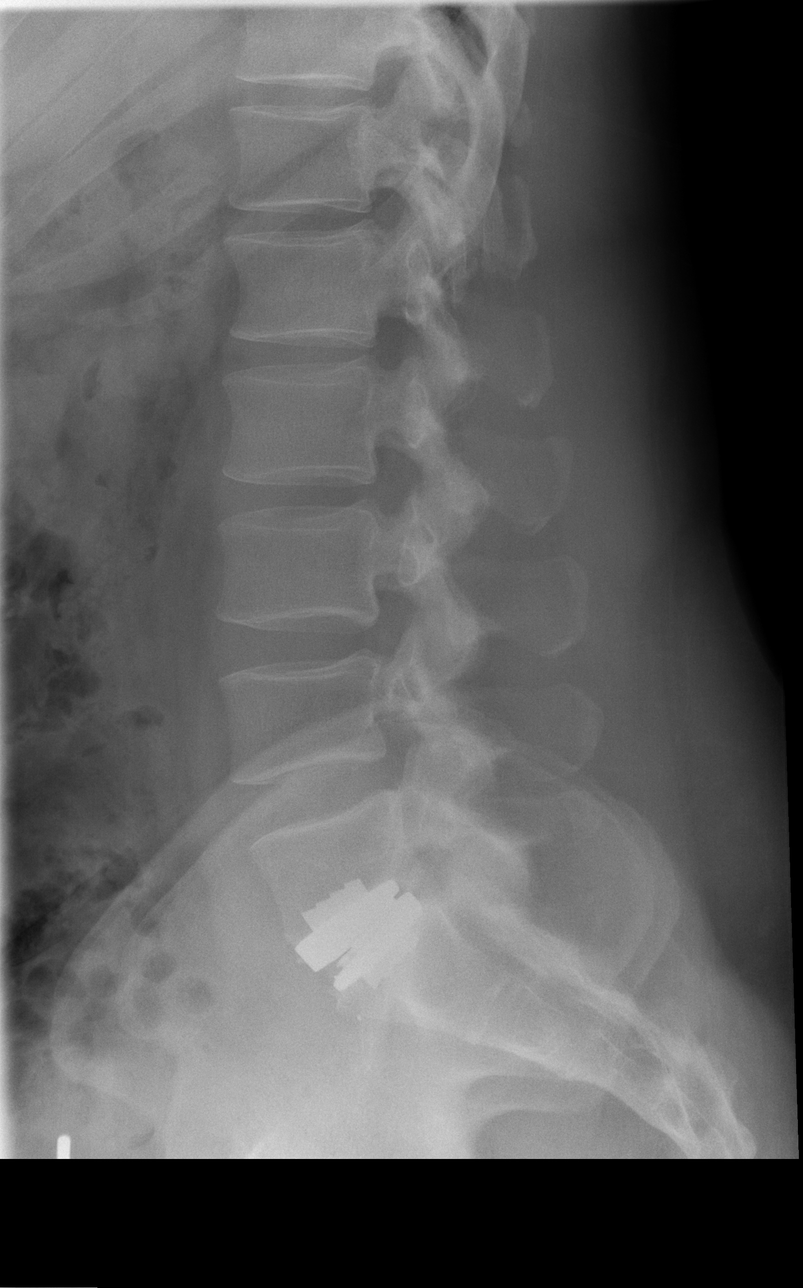

[t l-spine l5-s1 spot *]
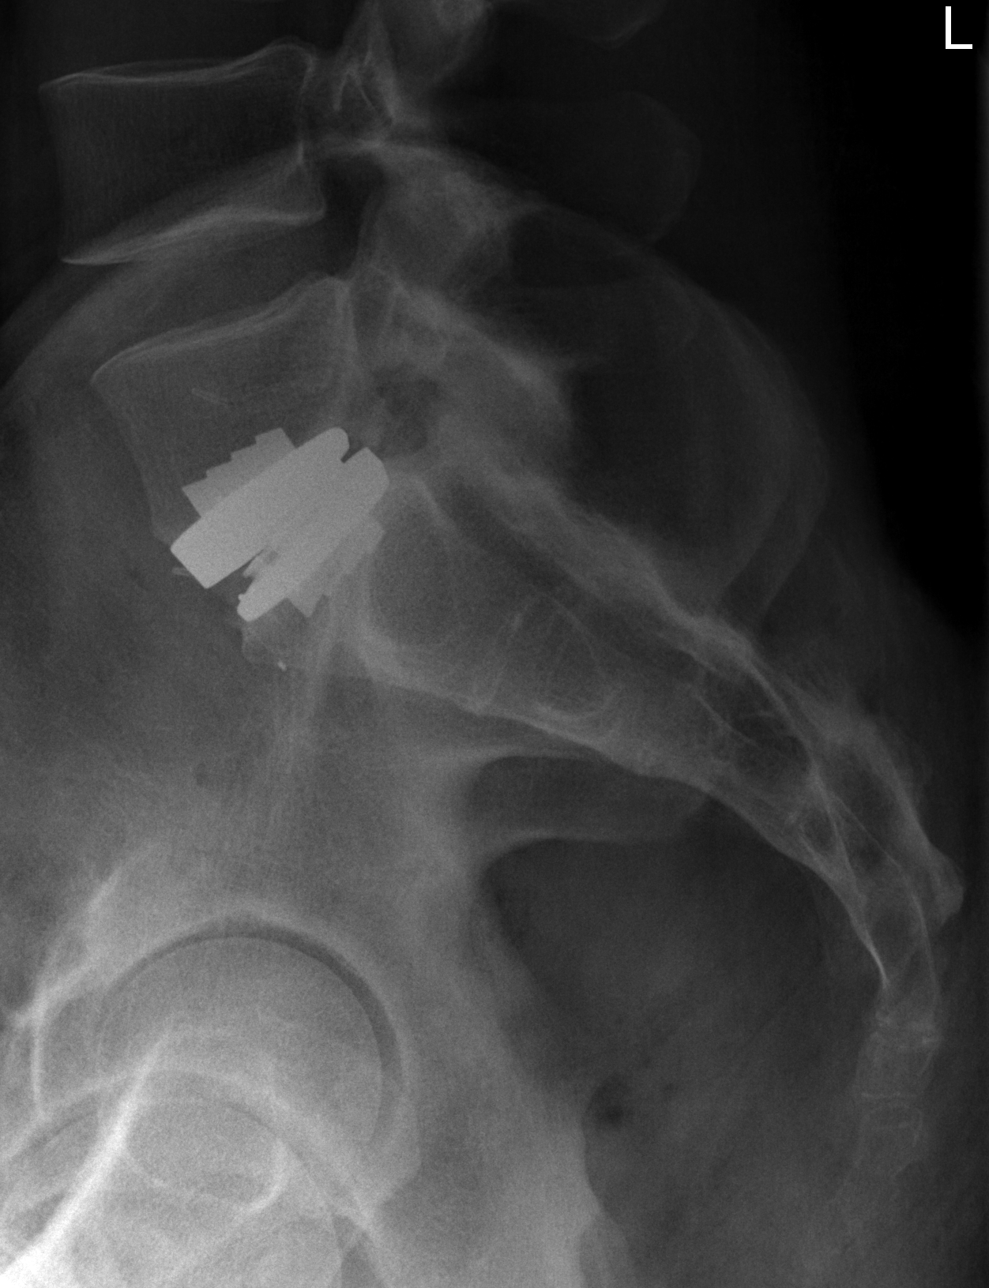

[5 of 5 positions shown; findings below may reference images not displayed]

FINDINGS: Post L5-S1 disc replacement surgery.  No change in
position or alignment of bony elements or hardware.  No acute
findings or interval change.  Disc height normal.
IMPRESSION: Stable postoperative changes at L5-S1.  No acute findings or
significant interval change.

## 2011-11-16 ENCOUNTER — Emergency Department (HOSPITAL_COMMUNITY)
Admission: EM | Admit: 2011-11-16 | Discharge: 2011-11-16 | Disposition: A | Discharge status: Home / Self Care | Payer: Medicare Other | Attending: Emergency Medicine | Admitting: Emergency Medicine

## 2011-11-16 ENCOUNTER — Encounter (HOSPITAL_COMMUNITY): Payer: Self-pay | Admitting: *Deleted

## 2011-11-16 ENCOUNTER — Emergency Department (HOSPITAL_COMMUNITY): Payer: Medicare Other

## 2011-11-16 DIAGNOSIS — F17200 Nicotine dependence, unspecified, uncomplicated: Secondary | ICD-10-CM | POA: Insufficient documentation

## 2011-11-16 DIAGNOSIS — M79609 Pain in unspecified limb: Secondary | ICD-10-CM | POA: Insufficient documentation

## 2011-11-16 DIAGNOSIS — M79672 Pain in left foot: Secondary | ICD-10-CM

## 2011-11-16 DIAGNOSIS — I10 Essential (primary) hypertension: Secondary | ICD-10-CM | POA: Insufficient documentation

## 2011-11-16 DIAGNOSIS — Z886 Allergy status to analgesic agent status: Secondary | ICD-10-CM | POA: Insufficient documentation

## 2011-11-16 DIAGNOSIS — E7800 Pure hypercholesterolemia, unspecified: Secondary | ICD-10-CM | POA: Insufficient documentation

## 2011-11-16 HISTORY — DX: Essential (primary) hypertension: I10

## 2011-11-16 MED ORDER — METOPROLOL TARTRATE 100 MG PO TABS: 100.0000 mg | ORAL_TABLET | Freq: Every day | ORAL | Status: DC

## 2011-11-16 MED ORDER — HYDROCODONE-ACETAMINOPHEN 5-325 MG PO TABS: 1.0000 | ORAL_TABLET | ORAL | Status: AC | PRN

## 2011-11-16 NOTE — Discharge Instructions (Signed)
Return to the Emergency Department if pain worsens, your toe becomes red, warm to the touch,  you are unable to move your toe, or you develop fever or chills.   Follow up with your primary care physician. Take pain medication as prescribed.  Do not drive or operate heavy machinery while taking pain medication.

## 2011-11-16 NOTE — ED Notes (Signed)
Pt reports progressively worsening pain to left pinky toe, states this has happened before and he had to have toe nail removed. Pt states pain is radiating up foot. Denies any injury to toe.

## 2011-11-16 NOTE — ED Provider Notes (Signed)
History     CSN: [id number]  Arrival date & time 11/16/11  1335   First MD Initiated Contact with Patient 11/16/11 1757      Chief Complaint  Patient presents with  . Foot Pain    (Consider location/radiation/quality/duration/timing/severity/associated sxs/prior treatment) HPI Comments: Patient reports that he has been having left foot pain for the past two days.  Pain is worse in the left great toe and radiates to the rest of his foot.  He reports that six months ago he had similar pain and had his toenail removed.  He reports that his toenail has been growing back appropriately, but it has been yellowish in color and thickened.  He has not noticed an ingrown toenail.  He had not observed any redness, warmth to the touch, or swelling.  No known trauma or injury.  Pain worse when walking.  He has not taken anything for the symptoms.  No prior history of Gout.  The history is provided by the patient.    Past Medical History  Diagnosis Date  . Hypertension   . High cholesterol     History reviewed. No pertinent past surgical history.  History reviewed. No pertinent family history.  History  Substance Use Topics  . Smoking status: Current Some Day Smoker  . Smokeless tobacco: Not on file  . Alcohol Use: No      Review of Systems  Constitutional: Negative for fever and chills.  Gastrointestinal: Negative for nausea and vomiting.  Musculoskeletal: Negative for joint swelling and gait problem.  Skin: Negative for color change.    Allergies  Nsaids  Home Medications   Current Outpatient Rx  Name Route Sig Dispense Refill  . ALBUTEROL SULFATE HFA 108 (90 BASE) MCG/ACT IN AERS Inhalation Inhale 2 puffs into the lungs every 6 (six) hours as needed. For shortness of breath    . ATORVASTATIN CALCIUM 40 MG PO TABS Oral Take 40 mg by mouth daily.    Marland Kitchen METOPROLOL SUCCINATE ER 100 MG PO TB24 Oral Take 100 mg by mouth daily. Take with or immediately following a meal.    .  MONTELUKAST SODIUM 10 MG PO TABS Oral Take 10 mg by mouth at bedtime.      BP 131/74  Pulse 100  Temp(Src) 98.5 F (36.9 C) (Oral)  Resp 20  SpO2 94%  Physical Exam  Nursing note and vitals reviewed. Constitutional: He appears well-developed and well-nourished. No distress.  HENT:  Head: Normocephalic and atraumatic.  Cardiovascular: Normal rate, regular rhythm and normal heart sounds.   Pulmonary/Chest: Effort normal.  Musculoskeletal: Normal range of motion. He exhibits no edema and no tenderness.       Patient able to wiggle all of his toes.  No pain to ROM of the left great toe.  Neurological: He is alert. No sensory deficit. Gait normal.       Distal sensation intact.  Skin: Skin is warm and dry. No bruising noted. He is not diaphoretic. No erythema.       No erythema, swelling, or warmth to the touch of the left foot.  No obvious ingrown toe nail.  Left great toenail yellowish in color and thickened.  Psychiatric: He has a normal mood and affect.    ED Course  Procedures (including critical care time)  Labs Reviewed - No data to display Dg Toe Great Left  11/16/2011  *RADIOLOGY REPORT*  Clinical Data: Foot pain  LEFT GREAT TOE  Comparison: None  Findings: There is no  evidence of fracture or dislocation.  There is no evidence of arthropathy or other focal bone abnormality. Soft tissues are unremarkable.  IMPRESSION: Negative examination.  Original Report Authenticated By: Rosealee Albee, M.D.     No diagnosis found.    MDM  Negative xray.  Neurovascularly intact.  No erythema, warmth, or swelling.  Therefore, do not think pain is caused by Gout of septic joint.  Patient instructed to follow up with PCP.        Pascal Lux Avalon, PA-C 11/17/11 [phone]

## 2011-11-17 NOTE — ED Provider Notes (Signed)
Medical screening examination/treatment/procedure(s) were performed by non-physician practitioner and as supervising physician I was immediately available for consultation/collaboration.   Dione Booze, MD 11/17/11 [phone]

## 2011-12-22 ENCOUNTER — Emergency Department (HOSPITAL_COMMUNITY)
Admission: EM | Admit: 2011-12-22 | Discharge: 2011-12-22 | Disposition: A | Discharge status: Home / Self Care | Payer: Medicare Other | Attending: Emergency Medicine | Admitting: Emergency Medicine

## 2011-12-22 ENCOUNTER — Encounter (HOSPITAL_COMMUNITY): Payer: Self-pay

## 2011-12-22 DIAGNOSIS — M549 Dorsalgia, unspecified: Secondary | ICD-10-CM | POA: Insufficient documentation

## 2011-12-22 DIAGNOSIS — F17200 Nicotine dependence, unspecified, uncomplicated: Secondary | ICD-10-CM | POA: Insufficient documentation

## 2011-12-22 DIAGNOSIS — M545 Low back pain, unspecified: Secondary | ICD-10-CM

## 2011-12-22 DIAGNOSIS — I10 Essential (primary) hypertension: Secondary | ICD-10-CM | POA: Insufficient documentation

## 2011-12-22 DIAGNOSIS — G8929 Other chronic pain: Secondary | ICD-10-CM | POA: Insufficient documentation

## 2011-12-22 DIAGNOSIS — E7800 Pure hypercholesterolemia, unspecified: Secondary | ICD-10-CM | POA: Insufficient documentation

## 2011-12-22 MED ORDER — CYCLOBENZAPRINE HCL 10 MG PO TABS: 10.0000 mg | ORAL_TABLET | Freq: Three times a day (TID) | ORAL | Status: AC | PRN

## 2011-12-22 MED ORDER — PREDNISONE 50 MG PO TABS: 50.0000 mg | ORAL_TABLET | Freq: Every day | ORAL | Status: AC

## 2011-12-22 MED ORDER — HYDROCODONE-ACETAMINOPHEN 5-325 MG PO TABS: 1.0000 | ORAL_TABLET | Freq: Four times a day (QID) | ORAL | Status: DC | PRN

## 2011-12-22 NOTE — ED Provider Notes (Signed)
Medical screening examination/treatment/procedure(s) were performed by non-physician practitioner and as supervising physician I was immediately available for consultation/collaboration.   Dayton Bailiff, MD 12/22/11 1531

## 2011-12-22 NOTE — ED Notes (Addendum)
Hx: Chronic back pain. 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  Has been having worsening low back pain and pain down LT leg gradually over last week.  Has been waiting to hear from pain management MD.  Pt uses a cane to ambulate and is walking w/difficulty.

## 2011-12-22 NOTE — Discharge Instructions (Signed)
Follow up with your doctor for pain management. Return here as needed. Ice and heat on your back.

## 2011-12-22 NOTE — ED Provider Notes (Signed)
History     CSN: [id number]  Arrival date & time 12/22/11  1246   First MD Initiated Contact with Patient 12/22/11 1256      Chief Complaint  Patient presents with  . Back Pain  . Leg Pain    (Consider location/radiation/quality/duration/timing/severity/associated sxs/prior treatment) HPI The patient presents to the ER with chronic low back pain and sciatica for a long time. The patient states that his back doctor is suppose to refer him for pain management but has not done so at this time. The patient denies any new features to his condition. The patient denies fevers, cancer history, weakness, numbness, N/V, abd pain, or bowel/bladder incontinence. 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   Past Medical History  Diagnosis Date  . Hypertension   . High cholesterol     Past Surgical History  Procedure Date  . Back surgery     History reviewed. No pertinent family history.  History  Substance Use Topics  . Smoking status: Current Some Day Smoker  . Smokeless tobacco: Not on file  . Alcohol Use: No      Review of Systems All other systems negative except as documented in the HPI. All pertinent positives and negatives as reviewed in the HPI.  Allergies  Nsaids  Home Medications   Current Outpatient Rx  Name Route Sig Dispense Refill  . ALBUTEROL SULFATE HFA 108 (90 BASE) MCG/ACT IN AERS Inhalation Inhale 2 puffs into the lungs every 6 (six) hours as needed. For shortness of breath    . ATORVASTATIN CALCIUM 40 MG PO TABS Oral Take 40 mg by mouth daily.    Marland Kitchen METOPROLOL TARTRATE 100 MG PO TABS Oral Take 100 mg by mouth 2 (two) times daily.    Marland Kitchen MONTELUKAST SODIUM 10 MG PO TABS Oral Take 10 mg by mouth at bedtime.      BP 120/80  Pulse 86  Temp(Src) 99.1 F (37.3 C) (Oral)  Resp 16  Ht 5' 10.5" (1.791 m)  Wt 262 lb (118.842 kg)  BMI 37.06 kg/m2  SpO2 100%  Physical Exam Physical Examination: General appearance - alert, well  appearing, and in no distress, oriented to person, place, and time and overweight Mental status - alert, oriented to person, place, and time, normal mood, behavior, speech, dress, motor activity, and thought processes Chest - clear to auscultation, no wheezes, rales or rhonchi, symmetric air entry Heart - normal rate, regular rhythm, normal S1, S2, no murmurs, rubs, clicks or gallops Back exam - tenderness noted at the left lower lumbar region in the L4-5 area, normal reflexes and strength bilateral lower extremities, sensory exam intact bilateral lower extremities. Patient has normal gait for his condition. Neurological - alert, oriented, normal speech, no focal findings or movement disorder noted, DTR's normal and symmetric, motor and sensory grossly normal bilaterally, normal muscle tone, no tremors, strength 5/5 Musculoskeletal - no joint tenderness, deformity or swelling  ED Course  Procedures (including critical care time)    The patient will be given pain control. He is advised that he needs to call his doctor to inquire about the pain management as the ER is not an effective tool for pain management. At this time the patient has no red flags with his chronic back pain. The patient neurological exam is normal for him.    MDM          Carlyle Dolly, PA-C 12/22/11 1331

## 2011-12-26 ENCOUNTER — Emergency Department (HOSPITAL_COMMUNITY)
Admission: EM | Admit: 2011-12-26 | Discharge: 2011-12-26 | Disposition: A | Discharge status: Home / Self Care | Payer: Medicare Other | Attending: Emergency Medicine | Admitting: Emergency Medicine

## 2011-12-26 ENCOUNTER — Encounter (HOSPITAL_COMMUNITY): Payer: Self-pay | Admitting: Emergency Medicine

## 2011-12-26 DIAGNOSIS — M545 Low back pain, unspecified: Secondary | ICD-10-CM | POA: Insufficient documentation

## 2011-12-26 DIAGNOSIS — R259 Unspecified abnormal involuntary movements: Secondary | ICD-10-CM | POA: Insufficient documentation

## 2011-12-26 DIAGNOSIS — E789 Disorder of lipoprotein metabolism, unspecified: Secondary | ICD-10-CM | POA: Insufficient documentation

## 2011-12-26 DIAGNOSIS — F17200 Nicotine dependence, unspecified, uncomplicated: Secondary | ICD-10-CM | POA: Insufficient documentation

## 2011-12-26 DIAGNOSIS — M79609 Pain in unspecified limb: Secondary | ICD-10-CM | POA: Insufficient documentation

## 2011-12-26 DIAGNOSIS — G8929 Other chronic pain: Secondary | ICD-10-CM | POA: Insufficient documentation

## 2011-12-26 DIAGNOSIS — Z79899 Other long term (current) drug therapy: Secondary | ICD-10-CM | POA: Insufficient documentation

## 2011-12-26 DIAGNOSIS — I10 Essential (primary) hypertension: Secondary | ICD-10-CM | POA: Insufficient documentation

## 2011-12-26 MED ORDER — HYDROCODONE-ACETAMINOPHEN 5-325 MG PO TABS: 1.0000 | ORAL_TABLET | Freq: Four times a day (QID) | ORAL | Status: AC | PRN

## 2011-12-26 NOTE — ED Notes (Signed)
Pt reports chronic leg and back pain, states he has a referral to a pain clinic and contacted them today and was told it would be next week before they could get him in.  Pt here for pain medication until his appt.

## 2011-12-26 NOTE — Discharge Instructions (Signed)
Chronic Back Pain When back pain lasts longer than 3 months, it is called chronic back pain.This pain can be frustrating, but the cause of the pain is rarely dangerous.People with chronic back pain often go through certain periods that are more intense (flare-ups). CAUSES Chronic back pain can be caused by wear and tear (degeneration) on different structures in your back. These structures may include bones, ligaments, or discs. This degeneration may result in more pressure being placed on the nerves that travel to your legs and feet. This can lead to pain traveling from the low back down the back of the legs. When pain lasts longer than 3 months, it is not unusual for people to experience anxiety or depression. Anxiety and depression can also contribute to low back pain. TREATMENT  Establish a regular exercise plan. This is critical to improving your functional level.   Have a self-management plan for when you flare-up. Flare-ups rarely require a medical visit. Regular exercise will help reduce the intensity and frequency of your flare-ups.   Manage how you feel about your back pain and the rest of your life. Anxiety, depression, and feeling that you cannot alter your back pain have been shown to make back pain more intense and debilitating.   Medicines should never be your only treatment. They should be used along with other treatments to help you return to a more active lifestyle.   Procedures such as injections or surgery may be helpful but are rarely necessary. You may be able to get the same results with physical therapy or chiropractic care.  HOME CARE INSTRUCTIONS  Avoid bending, heavy lifting, prolonged sitting, and activities which make the problem worse.   Continue normal activity as much as possible.   Take brief periods of rest throughout the day to reduce your pain during flare-ups.   Follow your back exercise rehabilitation program. This can help reduce symptoms and prevent  more pain.   Only take over-the-counter or prescription medicines as directed by your caregiver. Muscle relaxants are sometimes prescribed. Narcotic pain medicine is discouraged for long-term pain, since addiction is a possible outcome.   If you smoke, quit.   Eat healthy foods and maintain a recommended body weight.  SEEK IMMEDIATE MEDICAL CARE IF:   You have weakness or numbness in one of your legs or feet.   You have trouble controlling your bladder or bowels.   You develop nausea, vomiting, abdominal pain, shortness of breath, or fainting.  Document Released: 09/22/2004 Document Revised: 08/04/2011 Document Reviewed: 07/30/2011 [employer] Patient Information 2012 [employer]. 

## 2011-12-26 NOTE — ED Provider Notes (Signed)
History     CSN: [id number]  Arrival date & time 12/26/11  1846   First MD Initiated Contact with Patient 12/26/11 2013      Chief Complaint  Patient presents with  . Back Pain  . Leg Pain  . Medication Refill    (Consider location/radiation/quality/duration/timing/severity/associated sxs/prior treatment) HPI  51 year old male with history of chronic back pain presents with chief complaints of back and leg pain. Patient states his pain is chronic, and is persistent. 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  Described as a sharp and throbbing sensation worsened with movement. Denies fever, rash, or red flag sign.   The was scheduled to be seen at a pain clinic but unable to have an appointment to next week. Patient requests for pain medication in the meantime. Patient denies fever, chills, urinary retention, bowel incontinence, or caudal equina symptoms, or rash.   Past Medical History  Diagnosis Date  . Hypertension   . High cholesterol     Past Surgical History  Procedure Date  . Back surgery     History reviewed. No pertinent family history.  History  Substance Use Topics  . Smoking status: Current Some Day Smoker  . Smokeless tobacco: Not on file  . Alcohol Use: No      Review of Systems  All other systems reviewed and are negative.    Allergies  Nsaids  Home Medications   Current Outpatient Rx  Name Route Sig Dispense Refill  . ALBUTEROL SULFATE HFA 108 (90 BASE) MCG/ACT IN AERS Inhalation Inhale 2 puffs into the lungs every 6 (six) hours as needed. For shortness of breath    . ATORVASTATIN CALCIUM 40 MG PO TABS Oral Take 40 mg by mouth daily.    . CYCLOBENZAPRINE HCL 10 MG PO TABS Oral Take 1 tablet (10 mg total) by mouth 3 (three) times daily as needed for muscle spasms. 15 tablet 0  . HYDROCODONE-ACETAMINOPHEN 5-325 MG PO TABS Oral Take 1 tablet by mouth every 6 (six) hours as needed for pain. 15 tablet 0  . METOPROLOL TARTRATE  100 MG PO TABS Oral Take 100 mg by mouth 2 (two) times daily.    Marland Kitchen MONTELUKAST SODIUM 10 MG PO TABS Oral Take 10 mg by mouth at bedtime.    Marland Kitchen PREDNISONE 50 MG PO TABS Oral Take 1 tablet (50 mg total) by mouth daily. 5 tablet 0    BP 124/68  Pulse 92  Temp(Src) 98.5 F (36.9 C) (Oral)  Resp 18  SpO2 99%  Physical Exam  Constitutional: He appears well-developed. No distress.  HENT:       Trabismus noted  Eyes: Conjunctivae are normal.  Neck: Neck supple.  Abdominal: Soft. There is no tenderness.  Musculoskeletal:       Right knee: Normal.       Left knee: Normal.       Cervical back: Normal.       Thoracic back: Normal.       Lumbar back: He exhibits decreased range of motion and tenderness. He exhibits no bony tenderness, no swelling, no edema and no deformity.  Neurological:  Reflex Scores:      Patellar reflexes are 2+ on the right side and 1+ on the left side.      No abnormal gait. Walks with a cane  Skin: No rash noted.    ED Course  Procedures (including critical care time)  Labs Reviewed - No data to  display No results found.   No diagnosis found.    MDM  Chronic pain, worsening since he has ran out of his regular pain medication. Schedule to be seen and managed by pain specialist. Patient states he has contacted his PCP, and pain clinic but would not have the next appointment next week and had increased pain without his medication. I have discussed with patient the importance of having this pain managed by one source. ER is not the appropriate place for chronic pain management. I have agreed to provide a short course of pain medication. Patient voiced understanding and agrees with plan.        Fayrene Helper, PA-C 12/26/11 2216

## 2011-12-27 NOTE — ED Provider Notes (Signed)
Medical screening examination/treatment/procedure(s) were performed by non-physician practitioner and as supervising physician I was immediately available for consultation/collaboration.   Rolan Bucco, MD 12/27/11 [phone]

## 2012-01-19 ENCOUNTER — Ambulatory Visit: Payer: Medicaid Other | Admitting: Physical Therapy

## 2012-01-26 ENCOUNTER — Ambulatory Visit: Payer: Medicaid Other | Admitting: Physical Therapy

## 2012-02-06 ENCOUNTER — Ambulatory Visit: Payer: Medicaid Other | Admitting: Physical Therapy

## 2012-02-14 ENCOUNTER — Ambulatory Visit: Payer: Medicaid Other | Admitting: Physical Therapy

## 2012-06-17 ENCOUNTER — Emergency Department (INDEPENDENT_AMBULATORY_CARE_PROVIDER_SITE_OTHER)
Admission: EM | Admit: 2012-06-17 | Discharge: 2012-06-17 | Disposition: A | Discharge status: Home / Self Care | Payer: Medicare Other | Source: Home / Self Care

## 2012-06-17 ENCOUNTER — Encounter (HOSPITAL_COMMUNITY): Payer: Self-pay | Admitting: Emergency Medicine

## 2012-06-17 DIAGNOSIS — E785 Hyperlipidemia, unspecified: Secondary | ICD-10-CM

## 2012-06-17 DIAGNOSIS — I10 Essential (primary) hypertension: Secondary | ICD-10-CM

## 2012-06-17 DIAGNOSIS — J309 Allergic rhinitis, unspecified: Secondary | ICD-10-CM

## 2012-06-17 DIAGNOSIS — G8929 Other chronic pain: Secondary | ICD-10-CM

## 2012-06-17 MED ORDER — EZETIMIBE 10 MG PO TABS: 10.0000 mg | ORAL_TABLET | Freq: Every day | ORAL | Status: DC

## 2012-06-17 MED ORDER — SIMVASTATIN 10 MG PO TABS: 10.0000 mg | ORAL_TABLET | Freq: Every day | ORAL | Status: DC

## 2012-06-17 MED ORDER — TRIAMCINOLONE ACETONIDE 0.1 % EX CREA: TOPICAL_CREAM | Freq: Two times a day (BID) | CUTANEOUS | Status: DC

## 2012-06-17 MED ORDER — VALSARTAN-HYDROCHLOROTHIAZIDE 320-25 MG PO TABS: 1.0000 | ORAL_TABLET | Freq: Every day | ORAL | Status: DC

## 2012-06-17 MED ORDER — FLUTICASONE FUROATE 27.5 MCG/SPRAY NA SUSP: 2.0000 | Freq: Every day | NASAL | Status: DC

## 2012-06-17 NOTE — ED Notes (Signed)
Waiting discharge papers 

## 2012-06-17 NOTE — ED Provider Notes (Signed)
History     CSN: [id number]  Arrival date & time 06/17/12  4098   None     Chief Complaint  Patient presents with  . Medication Refill    (Consider location/radiation/quality/duration/timing/severity/associated sxs/prior treatment) HPI Comments: 51 year old male presents with requests of refills on his chronic medications. He states he has a PCP because him money and they will not see him. He is requesting refills on his medicines for blood pressure cholesterol skin condition and a nasal spray. He denies any acute symptoms or problems although he does have chronic pain from an old injury. According to the substance reporting lists his been receiving at least 90 narcotic analgesics per month from the pain clinic with his last refill October 4. He states he threw his narcotics all way in and requested I prescribed him tramadol.   Past Medical History  Diagnosis Date  . Hypertension   . High cholesterol     Past Surgical History  Procedure Date  . Back surgery     History reviewed. No pertinent family history.  History  Substance Use Topics  . Smoking status: Current Some Day Smoker  . Smokeless tobacco: Not on file  . Alcohol Use: No      Review of Systems  Constitutional: Negative.   Respiratory: Negative for cough, chest tightness and shortness of breath.   Cardiovascular: Negative for chest pain.  Gastrointestinal: Negative.   Genitourinary: Negative.   Musculoskeletal: Positive for back pain, arthralgias and gait problem.  Neurological: Negative.     Allergies  Nsaids  Home Medications   Current Outpatient Rx  Name Route Sig Dispense Refill  . VALSARTAN-HYDROCHLOROTHIAZIDE 320-25 MG PO TABS Oral Take 1 tablet by mouth daily.    . ALBUTEROL SULFATE HFA 108 (90 BASE) MCG/ACT IN AERS Inhalation Inhale 2 puffs into the lungs every 6 (six) hours as needed. For shortness of breath    . ATORVASTATIN CALCIUM 40 MG PO TABS Oral Take 40 mg by mouth daily.    Marland Kitchen  EZETIMIBE 10 MG PO TABS Oral Take 10 mg by mouth daily.    Marland Kitchen EZETIMIBE 10 MG PO TABS Oral Take 1 tablet (10 mg total) by mouth daily. 30 tablet 0  . FLUTICASONE FUROATE 27.5 MCG/SPRAY NA SUSP Nasal Place 2 sprays into the nose daily.    Marland Kitchen FLUTICASONE FUROATE 27.5 MCG/SPRAY NA SUSP Nasal Place 2 sprays into the nose daily. 10 g 0  . METOPROLOL TARTRATE 100 MG PO TABS Oral Take 100 mg by mouth 2 (two) times daily.    Marland Kitchen MONTELUKAST SODIUM 10 MG PO TABS Oral Take 10 mg by mouth at bedtime.    Marland Kitchen SIMVASTATIN 10 MG PO TABS Oral Take 10 mg by mouth at bedtime.    Marland Kitchen SIMVASTATIN 10 MG PO TABS Oral Take 1 tablet (10 mg total) by mouth at bedtime. 30 tablet 0  . TRIAMCINOLONE ACETONIDE 0.1 % EX CREA Topical Apply topically 2 (two) times daily. Apply for 2 weeks. May use on face 30 g 0  . VALSARTAN-HYDROCHLOROTHIAZIDE 320-25 MG PO TABS Oral Take 1 tablet by mouth daily. 30 tablet 0    BP 120/85  Pulse 75  Temp 98.2 F (36.8 C) (Oral)  Resp 18  SpO2 100%  Physical Exam  Constitutional: He is oriented to person, place, and time. He appears well-developed and well-nourished.  HENT:  Head: Normocephalic and atraumatic.  Eyes: EOM are normal. Left eye exhibits no discharge.  Neck: Normal range of motion. Neck supple.  Cardiovascular: Normal rate, regular rhythm and normal heart sounds.   No murmur heard. Pulmonary/Chest: No respiratory distress. He has no wheezes.  Neurological: He is alert and oriented to person, place, and time. No cranial nerve deficit.  Skin: Skin is warm and dry.  Psychiatric: He has a normal mood and affect.    ED Course  Procedures (including critical care time)  Labs Reviewed - No data to display No results found.   1. HTN (hypertension)   2. Dyslipidemia   3. Allergic rhinitis   4. Chronic pain       MDM  This 30 day prescription for valsartan/hydrochlorothiazide 320-25 one daily.  Zetia 10 mg once each bedtime. Fluticasone nasal spray as  directed Simvastatin 10 mg each bedtime Triamcinolone 0.1% cream twice a day when necessary Will not refill tramadol at his request. He will follow up with pain clinic         Hayden Rasmussen, NP 06/17/12 1044

## 2012-06-17 NOTE — ED Notes (Signed)
Pt states not able to get in to see pcp at the time due to financial reasons.   Pt is currently out of meds and needs refill.   Recent hx of back surgery 2011. Pt states that he is on pain meds but wants something that is not a narcotic.

## 2012-06-17 NOTE — ED Provider Notes (Signed)
Medical screening examination/treatment/procedure(s) were performed by resident physician or non-physician practitioner and as supervising physician I was immediately available for consultation/collaboration.   Lyda Colcord DOUGLAS MD.    Niam Nepomuceno D Kadar Chance, MD 06/17/12 1459 

## 2012-11-15 ENCOUNTER — Encounter (HOSPITAL_COMMUNITY): Payer: Self-pay | Admitting: Emergency Medicine

## 2012-11-15 ENCOUNTER — Emergency Department (HOSPITAL_COMMUNITY)
Admission: EM | Admit: 2012-11-15 | Discharge: 2012-11-15 | Disposition: A | Discharge status: Home / Self Care | Payer: Medicare Other | Attending: Emergency Medicine | Admitting: Emergency Medicine

## 2012-11-15 DIAGNOSIS — Z79899 Other long term (current) drug therapy: Secondary | ICD-10-CM | POA: Insufficient documentation

## 2012-11-15 DIAGNOSIS — I10 Essential (primary) hypertension: Secondary | ICD-10-CM | POA: Insufficient documentation

## 2012-11-15 DIAGNOSIS — J3489 Other specified disorders of nose and nasal sinuses: Secondary | ICD-10-CM | POA: Insufficient documentation

## 2012-11-15 DIAGNOSIS — R519 Headache, unspecified: Secondary | ICD-10-CM

## 2012-11-15 DIAGNOSIS — Z8739 Personal history of other diseases of the musculoskeletal system and connective tissue: Secondary | ICD-10-CM | POA: Insufficient documentation

## 2012-11-15 DIAGNOSIS — B9689 Other specified bacterial agents as the cause of diseases classified elsewhere: Secondary | ICD-10-CM

## 2012-11-15 DIAGNOSIS — E7800 Pure hypercholesterolemia, unspecified: Secondary | ICD-10-CM | POA: Insufficient documentation

## 2012-11-15 DIAGNOSIS — R51 Headache: Secondary | ICD-10-CM | POA: Insufficient documentation

## 2012-11-15 DIAGNOSIS — J329 Chronic sinusitis, unspecified: Secondary | ICD-10-CM

## 2012-11-15 DIAGNOSIS — F17200 Nicotine dependence, unspecified, uncomplicated: Secondary | ICD-10-CM | POA: Insufficient documentation

## 2012-11-15 HISTORY — DX: Unspecified osteoarthritis, unspecified site: M19.90

## 2012-11-15 MED ORDER — OXYMETAZOLINE HCL 0.05 % NA SOLN
1.0000 | Freq: Once | NASAL | Status: AC
  Administered 2012-11-15: 1 via NASAL
  Filled 2012-11-15: qty 15

## 2012-11-15 MED ORDER — FLUTICASONE PROPIONATE 50 MCG/ACT NA SUSP: 2.0000 | Freq: Every day | NASAL | Status: DC

## 2012-11-15 MED ORDER — GUAIFENESIN ER 600 MG PO TB12: 1200.0000 mg | ORAL_TABLET | Freq: Two times a day (BID) | ORAL | Status: DC

## 2012-11-15 MED ORDER — AMOXICILLIN-POT CLAVULANATE 875-125 MG PO TABS: 1.0000 | ORAL_TABLET | Freq: Two times a day (BID) | ORAL | Status: DC

## 2012-11-15 NOTE — ED Provider Notes (Signed)
Medical screening examination/treatment/procedure(s) were performed by non-physician practitioner and as supervising physician I was immediately available for consultation/collaboration.    Neisha Hinger R Almendra Loria, MD 11/15/12 2348 

## 2012-11-15 NOTE — ED Provider Notes (Signed)
History  This chart was scribed for non-physician practitioner [name], PA-C working with [name], MD, by [name], ED Scribe. This patient was seen in room WTR7/WTR7 and the patient's care was started at 4:56 PM   CSN: [id number]  Arrival date & time 11/15/12  1534   First MD Initiated Contact with Patient 11/15/12 1651      Chief Complaint  Patient presents with  . Headache    Pt reports 2 week hx of facial pain, unresponsive to antibiotic  . Facial Pain    The history is provided by the patient. No language interpreter was used.   Carl Orozco is a 52 y.o. male who presents to the Emergency Department complaining of sinus congestion and pressure, mostly to the right side, that started two weeks ago and has gradually worsened.  Pt is experiencing left sided associated headache.  Blowing his nose makes the pain worse.  He was seen at Urgent Care and was prescribed antibiotics and Zyrtec.  He reports that the symptoms have worsened since visiting Urgent Care and have lateralized to the left.  He denies rhinorrhea, sore throat, cough, chest pain, SOB.  Pt has h/o back surgery.  He reports he takes percocet for the back pain and states this has helped somewhat with the headache.  He has h/o HTN and high cholesterol.     Past Medical History  Diagnosis Date  . Hypertension   . High cholesterol   . Arthritis     Past Surgical History  Procedure Laterality Date  . Back surgery      Family History  Problem Relation Age of Onset  . Hypertension Mother     History  Substance Use Topics  . Smoking status: Current Some Day Smoker  . Smokeless tobacco: Not on file  . Alcohol Use: No      Review of Systems  Constitutional: Negative for fever and chills.  HENT: Positive for congestion and sinus pressure. Negative for ear pain, sore throat and rhinorrhea.   Respiratory: Negative for cough and shortness of breath.   Gastrointestinal: Negative for nausea  and vomiting.  Neurological: Positive for headaches.  All other systems reviewed and are negative.    Allergies  Nsaids  Home Medications   Current Outpatient Rx  Name  Route  Sig  Dispense  Refill  . albuterol (PROVENTIL HFA;VENTOLIN HFA) 108 (90 BASE) MCG/ACT inhaler   Inhalation   Inhale 2 puffs into the lungs every 6 (six) hours as needed. For shortness of breath         . ezetimibe (ZETIA) 10 MG tablet   Oral   Take 10 mg by mouth every evening.          . simvastatin (ZOCOR) 10 MG tablet   Oral   Take 10 mg by mouth at bedtime.         . valsartan-hydrochlorothiazide (DIOVAN-HCT) 320-25 MG per tablet   Oral   Take 1 tablet by mouth every morning.          Marland Kitchen amoxicillin-clavulanate (AUGMENTIN) 875-125 MG per tablet   Oral   Take 1 tablet by mouth every 12 (twelve) hours.   14 tablet   0   . fluticasone (FLONASE) 50 MCG/ACT nasal spray   Nasal   Place 2 sprays into the nose daily.   16 g   2   . guaiFENesin (MUCINEX) 600 MG 12 hr tablet   Oral   Take 2 tablets (1,200 mg  total) by mouth 2 (two) times daily.   30 tablet   0     BP 154/104  Pulse 73  Temp(Src) 99.2 F (37.3 C) (Oral)  SpO2 99%  Physical Exam  Nursing note and vitals reviewed. Constitutional: He is oriented to person, place, and time. He appears well-developed and well-nourished. No distress.  HENT:  Head: Normocephalic and atraumatic.  Right Ear: External ear and ear canal normal. Tympanic membrane is bulging (clear fluid behind ). Tympanic membrane is not erythematous. A middle ear effusion is present.  Left Ear: Tympanic membrane, external ear and ear canal normal. Tympanic membrane is not erythematous and not bulging.  Nose: Mucosal edema and rhinorrhea present. Right sinus exhibits no maxillary sinus tenderness and no frontal sinus tenderness. Left sinus exhibits maxillary sinus tenderness and frontal sinus tenderness.  Mouth/Throat: Uvula is midline, oropharynx is clear and  moist and mucous membranes are normal. Mucous membranes are not dry and not cyanotic. No edematous. No oropharyngeal exudate, posterior oropharyngeal edema, posterior oropharyngeal erythema or tonsillar abscesses.  Small amount of blood to left nostril.  Left sided tonsillar adenopathy.  Frontal sinus tenderness to palpation.    Eyes: Conjunctivae are normal. Pupils are equal, round, and reactive to light. No scleral icterus.  Left eye strabismus  Neck: Normal range of motion. Neck supple.  Cardiovascular: Normal rate, regular rhythm and intact distal pulses.   Pulmonary/Chest: Effort normal and breath sounds normal. No respiratory distress. He has no wheezes. He has no rales.  Abdominal: Soft. Bowel sounds are normal. He exhibits no distension. There is no tenderness.  Musculoskeletal: Normal range of motion. He exhibits no edema.  Lymphadenopathy:    He has no cervical adenopathy.  Neurological: He is alert and oriented to person, place, and time.  Speech is clear and goal oriented Moves extremities without ataxia  Skin: Skin is warm and dry. No rash noted. He is not diaphoretic. No erythema.  Psychiatric: He has a normal mood and affect. His behavior is normal.    ED Course  Procedures  DIAGNOSTIC STUDIES: Oxygen Saturation is 99% on room air, normal by my interpretation.    COORDINATION OF CARE:  5:06 PM Discussed course of care with pt which includes nasal decongestant, Flonase, and Augmentin.  Advised pt to return if symptoms worsen.  Pt understands and agrees.    Labs Reviewed - No data to display No results found.   1. Sinusitis, bacterial   2. Frontal headache       MDM  [name] presents with symptoms and Hx consistent with sinusitis, likely bacterial.   Severe symptoms have been present for greater than 10 days without purulent nasal discharge and persistent unilateral maxillary sinus pain.  Concern for acute bacterial rhinosinusitis.  Patient discharged  with Augmentin, Flonase and mucinex.  Instructions given for warm saline nasal wash and recommendations for follow-up with primary care physician.    I personally performed the services described in this documentation, which was scribed in my presence. The recorded information has been reviewed and is accurate.       Dahlia Client Sou Nohr, PA-C 11/15/12 1747

## 2012-11-15 NOTE — ED Notes (Signed)
Pt reports headache in front of head, sinus pressure x 2 weeks

## 2013-01-31 ENCOUNTER — Encounter (HOSPITAL_COMMUNITY): Payer: Self-pay

## 2013-01-31 ENCOUNTER — Emergency Department (HOSPITAL_COMMUNITY)
Admission: EM | Admit: 2013-01-31 | Discharge: 2013-01-31 | Disposition: A | Discharge status: Home / Self Care | Payer: Medicare Other | Attending: Emergency Medicine | Admitting: Emergency Medicine

## 2013-01-31 DIAGNOSIS — Z760 Encounter for issue of repeat prescription: Secondary | ICD-10-CM

## 2013-01-31 DIAGNOSIS — J3489 Other specified disorders of nose and nasal sinuses: Secondary | ICD-10-CM | POA: Insufficient documentation

## 2013-01-31 DIAGNOSIS — E7800 Pure hypercholesterolemia, unspecified: Secondary | ICD-10-CM | POA: Insufficient documentation

## 2013-01-31 DIAGNOSIS — I10 Essential (primary) hypertension: Secondary | ICD-10-CM | POA: Insufficient documentation

## 2013-01-31 DIAGNOSIS — Z79899 Other long term (current) drug therapy: Secondary | ICD-10-CM | POA: Insufficient documentation

## 2013-01-31 DIAGNOSIS — F17200 Nicotine dependence, unspecified, uncomplicated: Secondary | ICD-10-CM | POA: Insufficient documentation

## 2013-01-31 DIAGNOSIS — Z8739 Personal history of other diseases of the musculoskeletal system and connective tissue: Secondary | ICD-10-CM | POA: Insufficient documentation

## 2013-01-31 MED ORDER — TRIAMCINOLONE ACETONIDE 0.1 % EX CREA: 1.0000 "application " | TOPICAL_CREAM | Freq: Every day | CUTANEOUS | Status: DC | PRN

## 2013-01-31 MED ORDER — VALSARTAN-HYDROCHLOROTHIAZIDE 320-25 MG PO TABS: 1.0000 | ORAL_TABLET | Freq: Every day | ORAL | Status: DC

## 2013-01-31 NOTE — ED Notes (Signed)
Patient c/o sinus pressure x 2 months and being out of BP meds x 3-4 days and is here for a medication refill.

## 2013-01-31 NOTE — ED Provider Notes (Signed)
History     CSN: [id number]  Arrival date & time 01/31/13  0914   First MD Initiated Contact with Patient 01/31/13 [phone]      Chief Complaint  Patient presents with  . Facial Pain  . Medication Refill    (Consider location/radiation/quality/duration/timing/severity/associated sxs/prior treatment) The history is provided by the patient.   vision presents the emergency department questioning a refill of his Diovan.  His been out of his blood pressure medications for 3 or 4 days and currently he is between primary care providers.  He denies headache.  No chest pain or shortness of breath.  No abdominal pain nausea vomiting or diarrhea.  He requests medication refill.  He also does report some sinus pressure in his right side of his frontal and maxillary sinuses over the past 2 months.  This has been intermittent and does not seem to respond to Claritin or Zyrtec.  He's been considering trying Allegra but was concerned about his blood pressure.  He denies fevers and chills.  No dental pain.  No sore throat.  Past Medical History  Diagnosis Date  . Hypertension   . High cholesterol   . Arthritis     Past Surgical History  Procedure Laterality Date  . Back surgery      Family History  Problem Relation Age of Onset  . Hypertension Mother     History  Substance Use Topics  . Smoking status: Current Some Day Smoker -- 0.50 packs/day    Types: Cigarettes  . Smokeless tobacco: Never Used  . Alcohol Use: No      Review of Systems  All other systems reviewed and are negative.    Allergies  Aspirin and Nsaids  Home Medications   Current Outpatient Rx  Name  Route  Sig  Dispense  Refill  . albuterol (PROVENTIL HFA;VENTOLIN HFA) 108 (90 BASE) MCG/ACT inhaler   Inhalation   Inhale 2 puffs into the lungs every 6 (six) hours as needed for wheezing or shortness of breath. For shortness of breath         . ezetimibe (ZETIA) 10 MG tablet   Oral   Take 10 mg by mouth every  morning.          . fluticasone (FLONASE) 50 MCG/ACT nasal spray   Nasal   Place 2 sprays into the nose daily as needed for rhinitis or allergies.         Marland Kitchen oxyCODONE-acetaminophen (PERCOCET) 5-325 MG per tablet   Oral   Take 1 tablet by mouth 3 (three) times daily as needed for pain.         . simvastatin (ZOCOR) 10 MG tablet   Oral   Take 10 mg by mouth at bedtime.         . triamcinolone cream (KENALOG) 0.1 %   Topical   Apply 1 application topically daily as needed (rash).   30 g   3   . valsartan-hydrochlorothiazide (DIOVAN-HCT) 320-25 MG per tablet   Oral   Take 1 tablet by mouth daily.   30 tablet   2     BP 142/110  Pulse 80  Temp(Src) 98.8 F (37.1 C) (Oral)  Resp 18  Ht 5' 10.5" (1.791 m)  Wt 238 lb 2 oz (108.013 kg)  BMI 33.67 kg/m2  SpO2 100%  Physical Exam  Nursing note and vitals reviewed. Constitutional: He is oriented to person, place, and time. He appears well-developed and well-nourished.  HENT:  Head: Normocephalic and  atraumatic.  No sinus tenderness.  Bilateral TMs are normal.  Eyes: EOM are normal.  Neck: Normal range of motion.  Cardiovascular: Normal rate, regular rhythm, normal heart sounds and intact distal pulses.   Pulmonary/Chest: Effort normal and breath sounds normal. No respiratory distress.  Abdominal: Soft. He exhibits no distension. There is no tenderness.  Genitourinary: Rectum normal.  Musculoskeletal: Normal range of motion.  Neurological: He is alert and oriented to person, place, and time.  Skin: Skin is warm and dry.  Psychiatric: He has a normal mood and affect. Judgment normal.    ED Course  Procedures (including critical care time)  Labs Reviewed - No data to display No results found.   1. Hypertension   2. Medication refill       MDM  A. symptomatic hypertension.  Medication refill.  Doubt sinusitis.  Discharge home with instructions to followup with primary care physician.  He understands to  return the emergency apartment for new worsening symptoms        Lyanne Co, MD 01/31/13 1037

## 2013-04-17 ENCOUNTER — Encounter (HOSPITAL_COMMUNITY): Payer: Self-pay | Admitting: Emergency Medicine

## 2013-04-17 ENCOUNTER — Emergency Department (HOSPITAL_COMMUNITY)
Admission: EM | Admit: 2013-04-17 | Discharge: 2013-04-17 | Disposition: A | Discharge status: Home / Self Care | Payer: Medicare Other | Attending: Emergency Medicine | Admitting: Emergency Medicine

## 2013-04-17 DIAGNOSIS — IMO0002 Reserved for concepts with insufficient information to code with codable children: Secondary | ICD-10-CM | POA: Insufficient documentation

## 2013-04-17 DIAGNOSIS — Z79899 Other long term (current) drug therapy: Secondary | ICD-10-CM | POA: Insufficient documentation

## 2013-04-17 DIAGNOSIS — E7800 Pure hypercholesterolemia, unspecified: Secondary | ICD-10-CM | POA: Insufficient documentation

## 2013-04-17 DIAGNOSIS — Z8739 Personal history of other diseases of the musculoskeletal system and connective tissue: Secondary | ICD-10-CM | POA: Insufficient documentation

## 2013-04-17 DIAGNOSIS — K0889 Other specified disorders of teeth and supporting structures: Secondary | ICD-10-CM

## 2013-04-17 DIAGNOSIS — K029 Dental caries, unspecified: Secondary | ICD-10-CM | POA: Insufficient documentation

## 2013-04-17 DIAGNOSIS — K089 Disorder of teeth and supporting structures, unspecified: Secondary | ICD-10-CM | POA: Insufficient documentation

## 2013-04-17 DIAGNOSIS — F17200 Nicotine dependence, unspecified, uncomplicated: Secondary | ICD-10-CM | POA: Insufficient documentation

## 2013-04-17 DIAGNOSIS — I10 Essential (primary) hypertension: Secondary | ICD-10-CM | POA: Insufficient documentation

## 2013-04-17 MED ORDER — HYDROCODONE-ACETAMINOPHEN 5-325 MG PO TABS
1.0000 | ORAL_TABLET | Freq: Once | ORAL | Status: AC
  Administered 2013-04-17: 1 via ORAL
  Filled 2013-04-17: qty 1

## 2013-04-17 MED ORDER — HYDROCODONE-ACETAMINOPHEN 5-325 MG PO TABS: 1.0000 | ORAL_TABLET | ORAL | Status: DC | PRN

## 2013-04-17 MED ORDER — SIMVASTATIN 10 MG PO TABS: 10.0000 mg | ORAL_TABLET | Freq: Every day | ORAL | Status: DC

## 2013-04-17 MED ORDER — EZETIMIBE 10 MG PO TABS: 10.0000 mg | ORAL_TABLET | Freq: Every morning | ORAL | Status: DC

## 2013-04-17 MED ORDER — AMOXICILLIN 500 MG PO CAPS: 500.0000 mg | ORAL_CAPSULE | Freq: Three times a day (TID) | ORAL | Status: DC

## 2013-04-17 NOTE — ED Notes (Signed)
Pt c/o dental pain (lt side) x a couple weeks.

## 2013-04-17 NOTE — ED Provider Notes (Signed)
CSN: [id number]     Arrival date & time 04/17/13  1142 History     First MD Initiated Contact with Patient 04/17/13 1157     Chief Complaint  Patient presents with  . Dental Pain   (Consider location/radiation/quality/duration/timing/severity/associated sxs/prior Treatment) HPI Comments: Patient is a 52 year old male past medical history significant for hypertension, high cholesterol presented to the emergency department for severe lower left-sided dental pain that has been going on for several weeks. Patient rates his pain severe and which is now radiating to left side of face. Patient states his pain is worsened with eating and drinking. No alleviating factors. Patient denies any fevers, chills, trismus, drooling, sore throat, neck pain, facial swelling, drainage from site.  Patient is a 52 y.o. male presenting with tooth pain.  Dental Pain Associated symptoms: no congestion, no drooling, no facial swelling, no fever, no neck pain and no oral lesions     Past Medical History  Diagnosis Date  . Hypertension   . High cholesterol   . Arthritis    Past Surgical History  Procedure Laterality Date  . Back surgery     Family History  Problem Relation Age of Onset  . Hypertension Mother    History  Substance Use Topics  . Smoking status: Current Some Day Smoker -- 0.50 packs/day    Types: Cigarettes  . Smokeless tobacco: Never Used  . Alcohol Use: No    Review of Systems  Constitutional: Negative for fever.  HENT: Positive for dental problem. Negative for congestion, sore throat, facial swelling, rhinorrhea, drooling, mouth sores, trouble swallowing, neck pain, neck stiffness and voice change.   Respiratory: Negative for shortness of breath.     Allergies  Aspirin and Nsaids  Home Medications   Current Outpatient Rx  Name  Route  Sig  Dispense  Refill  . albuterol (PROVENTIL HFA;VENTOLIN HFA) 108 (90 BASE) MCG/ACT inhaler   Inhalation   Inhale 2 puffs into the lungs  every 6 (six) hours as needed for wheezing or shortness of breath. For shortness of breath         . fluticasone (FLONASE) 50 MCG/ACT nasal spray   Nasal   Place 2 sprays into the nose daily as needed for rhinitis or allergies.         Marland Kitchen oxyCODONE-acetaminophen (PERCOCET) 5-325 MG per tablet   Oral   Take 1 tablet by mouth 3 (three) times daily as needed for pain.         Marland Kitchen triamcinolone cream (KENALOG) 0.1 %   Topical   Apply 1 application topically daily as needed (rash).   30 g   3   . valsartan-hydrochlorothiazide (DIOVAN-HCT) 320-25 MG per tablet   Oral   Take 1 tablet by mouth daily.   30 tablet   2   . amoxicillin (AMOXIL) 500 MG capsule   Oral   Take 1 capsule (500 mg total) by mouth 3 (three) times daily.   30 capsule   0   . ezetimibe (ZETIA) 10 MG tablet   Oral   Take 1 tablet (10 mg total) by mouth every morning.   30 tablet   0   . HYDROcodone-acetaminophen (NORCO/VICODIN) 5-325 MG per tablet   Oral   Take 1 tablet by mouth every 4 (four) hours as needed for pain.   15 tablet   0   . simvastatin (ZOCOR) 10 MG tablet   Oral   Take 1 tablet (10 mg total) by mouth at bedtime.  30 tablet   0    BP 118/70  Pulse 60  Temp(Src) 98.7 F (37.1 C)  Resp 16  SpO2 99% Physical Exam  Constitutional: He is oriented to person, place, and time. He appears well-developed and well-nourished. No distress.  HENT:  Head: Normocephalic and atraumatic.  Right Ear: External ear normal.  Left Ear: External ear normal.  Nose: Nose normal.  Mouth/Throat: Uvula is midline, oropharynx is clear and moist and mucous membranes are normal. No trismus in the jaw. Abnormal dentition. Dental caries present. No dental abscesses or edematous.    Eyes: Conjunctivae are normal.  Neck: Normal range of motion. Neck supple.  Pulmonary/Chest: Effort normal.  Musculoskeletal: Normal range of motion.  Neurological: He is alert and oriented to person, place, and time.  Skin:  Skin is warm and dry. He is not diaphoretic.  Psychiatric: He has a normal mood and affect.    ED Course   Procedures (including critical care time)  Labs Reviewed - No data to display No results found. 1. Pain, dental     MDM  Afebrile, NAD, non-toxic appearing, AAOx4. Patient with toothache.  No gross abscess.  Exam unconcerning for Ludwig's angina or spread of infection.  Will treat with penicillin and pain medicine.  Urged patient to follow-up with dentist. REturn precautions discussed. Patient is agreeable to plan. Patient is stable at time of discharge     Jeannetta Ellis, PA-C 04/17/13 1955

## 2013-04-18 NOTE — ED Provider Notes (Signed)
Medical screening examination/treatment/procedure(s) were performed by non-physician practitioner and as supervising physician I was immediately available for consultation/collaboration.    [name], MD 04/18/13 1538 

## 2013-05-03 ENCOUNTER — Encounter (HOSPITAL_COMMUNITY): Payer: Self-pay

## 2013-05-03 ENCOUNTER — Emergency Department (HOSPITAL_COMMUNITY)
Admission: EM | Admit: 2013-05-03 | Discharge: 2013-05-03 | Disposition: A | Discharge status: Home / Self Care | Payer: Medicare Other | Attending: Emergency Medicine | Admitting: Emergency Medicine

## 2013-05-03 DIAGNOSIS — Z8739 Personal history of other diseases of the musculoskeletal system and connective tissue: Secondary | ICD-10-CM | POA: Insufficient documentation

## 2013-05-03 DIAGNOSIS — I10 Essential (primary) hypertension: Secondary | ICD-10-CM | POA: Insufficient documentation

## 2013-05-03 DIAGNOSIS — E7800 Pure hypercholesterolemia, unspecified: Secondary | ICD-10-CM | POA: Insufficient documentation

## 2013-05-03 DIAGNOSIS — K029 Dental caries, unspecified: Secondary | ICD-10-CM

## 2013-05-03 DIAGNOSIS — Z79899 Other long term (current) drug therapy: Secondary | ICD-10-CM | POA: Insufficient documentation

## 2013-05-03 DIAGNOSIS — F17200 Nicotine dependence, unspecified, uncomplicated: Secondary | ICD-10-CM | POA: Insufficient documentation

## 2013-05-03 MED ORDER — HYDROCODONE-ACETAMINOPHEN 5-325 MG PO TABS: 1.0000 | ORAL_TABLET | Freq: Four times a day (QID) | ORAL | Status: DC | PRN

## 2013-05-03 NOTE — ED Provider Notes (Signed)
CSN: [id number]     Arrival date & time 05/03/13  1133 History   First MD Initiated Contact with Patient 05/03/13 1145     Chief Complaint  Patient presents with  . Dental Pain   (Consider location/radiation/quality/duration/timing/severity/associated sxs/prior Treatment) HPI Comments: Patient reports persistent pain in his left lower tooth x 1 month.  Pt was seen for the same on 04/17/13 and d/c home with penicillin and norco.  States he tooth the penicillin and it improved the swelling in his face.  States his pain persists, is constant, worse with eating or drinking.  He has taken nothing for the pain.  Pt was previously in pain management and per [facility] database had #90 percocet filled on Mar 29, 2013.  Pt states he is no longer in pain management and that these meds are gone.  Denies fevers, chills, sore throat, difficulty swallowing or breathing, facial swelling.  Pt states he has been trying in various ways to get to a dentist but he has no money to pay for them.  He intends to go [facility] the [facility] at the end of this month (27th/28th)  Patient is a 52 y.o. male presenting with tooth pain. The history is provided by the patient.  Dental Pain Associated symptoms: no congestion and no fever     Past Medical History  Diagnosis Date  . Hypertension   . High cholesterol   . Arthritis    Past Surgical History  Procedure Laterality Date  . Back surgery     Family History  Problem Relation Age of Onset  . Hypertension Mother    History  Substance Use Topics  . Smoking status: Current Some Day Smoker -- 0.50 packs/day    Types: Cigarettes  . Smokeless tobacco: Never Used  . Alcohol Use: No    Review of Systems  Constitutional: Negative for fever and chills.  HENT: Positive for dental problem. Negative for congestion, sore throat and trouble swallowing.   Respiratory: Negative for shortness of breath.     Allergies  Aspirin and Nsaids  Home Medications    Current Outpatient Rx  Name  Route  Sig  Dispense  Refill  . albuterol (PROVENTIL HFA;VENTOLIN HFA) 108 (90 BASE) MCG/ACT inhaler   Inhalation   Inhale 2 puffs into the lungs every 6 (six) hours as needed for wheezing or shortness of breath. For shortness of breath         . ezetimibe (ZETIA) 10 MG tablet   Oral   Take 1 tablet (10 mg total) by mouth every morning.   30 tablet   0   . fluticasone (FLONASE) 50 MCG/ACT nasal spray   Nasal   Place 2 sprays into the nose daily as needed for rhinitis or allergies.         Marland Kitchen HYDROcodone-acetaminophen (NORCO/VICODIN) 5-325 MG per tablet   Oral   Take 1 tablet by mouth every 4 (four) hours as needed for pain.   15 tablet   0   . simvastatin (ZOCOR) 10 MG tablet   Oral   Take 1 tablet (10 mg total) by mouth at bedtime.   30 tablet   0   . triamcinolone cream (KENALOG) 0.1 %   Topical   Apply 1 application topically daily as needed (rash).   30 g   3   . valsartan-hydrochlorothiazide (DIOVAN-HCT) 320-25 MG per tablet   Oral   Take 1 tablet by mouth daily.   30  tablet   2   . HYDROcodone-acetaminophen (NORCO/VICODIN) 5-325 MG per tablet   Oral   Take 1 tablet by mouth every 6 (six) hours as needed for pain.   10 tablet   0    BP 151/94  Pulse 65  Temp(Src) 98.7 F (37.1 C)  Resp 16  SpO2 100% Physical Exam  Nursing note and vitals reviewed. Constitutional: He appears well-developed and well-nourished. No distress.  HENT:  Head: Normocephalic and atraumatic.  Mouth/Throat: Uvula is midline. Mucous membranes are not dry. Dental caries present. No edematous. No oropharyngeal exudate, posterior oropharyngeal edema, posterior oropharyngeal erythema or tonsillar abscesses.    Mostly edentulous with only bottom front teeth.   Neck: Normal range of motion. Neck supple.  Pulmonary/Chest: Effort normal.  Neurological: He is alert.  Skin: He is not diaphoretic.    ED Course  Procedures (including critical care  time) Labs Review Labs Reviewed - No data to display Imaging Review No results found.  MDM   1. Pain due to dental caries    Pt with long standing dental decay and need for dentist.  No e/o dental abscess and patient has just finished course of antibiotics for same. No red flags.  Pt presenting for pain control.  Patient discharged with #10 Norco. Patient strongly advised to follow up with dentist.  Discussed findings, treatment, and follow up  with patient.  Pt given return precautions.  Pt verbalizes understanding and agrees with plan.      I doubt any other EMC precluding discharge at this time including, but not necessarily limited to the following: Ludwig angina,deep space head or neck infection    Trixie Dredge, PA-C 05/03/13 1656  Trixie Dredge, PA-C 05/03/13 1656

## 2013-05-03 NOTE — ED Notes (Signed)
Pt c/o a hole in tooth to bottom lt, c/o pain x3wks

## 2013-05-05 NOTE — ED Provider Notes (Signed)
Medical screening examination/treatment/procedure(s) were performed by non-physician practitioner and as supervising physician I was immediately available for consultation/collaboration.  Cylis Ayars T Karlton Maya, MD 05/05/13 2050 

## 2013-05-06 ENCOUNTER — Encounter (HOSPITAL_COMMUNITY): Payer: Self-pay | Admitting: Emergency Medicine

## 2013-05-06 ENCOUNTER — Emergency Department (HOSPITAL_COMMUNITY)
Admission: EM | Admit: 2013-05-06 | Discharge: 2013-05-06 | Disposition: A | Discharge status: Home / Self Care | Payer: Medicare Other | Attending: Emergency Medicine | Admitting: Emergency Medicine

## 2013-05-06 DIAGNOSIS — Z79899 Other long term (current) drug therapy: Secondary | ICD-10-CM | POA: Insufficient documentation

## 2013-05-06 DIAGNOSIS — F17200 Nicotine dependence, unspecified, uncomplicated: Secondary | ICD-10-CM | POA: Insufficient documentation

## 2013-05-06 DIAGNOSIS — M129 Arthropathy, unspecified: Secondary | ICD-10-CM | POA: Insufficient documentation

## 2013-05-06 DIAGNOSIS — I10 Essential (primary) hypertension: Secondary | ICD-10-CM | POA: Insufficient documentation

## 2013-05-06 DIAGNOSIS — K089 Disorder of teeth and supporting structures, unspecified: Secondary | ICD-10-CM | POA: Insufficient documentation

## 2013-05-06 DIAGNOSIS — K0889 Other specified disorders of teeth and supporting structures: Secondary | ICD-10-CM

## 2013-05-06 DIAGNOSIS — E7800 Pure hypercholesterolemia, unspecified: Secondary | ICD-10-CM | POA: Insufficient documentation

## 2013-05-06 MED ORDER — TRAMADOL HCL 50 MG PO TABS: 50.0000 mg | ORAL_TABLET | Freq: Four times a day (QID) | ORAL | Status: DC | PRN

## 2013-05-06 NOTE — ED Notes (Signed)
Pt states he has pain to L side area of mouth on bottom. Pt c/o pain, swelling throbbing to area for the past 3 weeks. Pt was taking Vicodin. States it did not work and now he is out of the Vicodin. Pt states he has no insurance for dental care. Pt states he has a ride home. Pt ambulatory to exam room with steady gait. Pt with no acute distress.

## 2013-05-06 NOTE — ED Provider Notes (Signed)
CSN: [id number]     Arrival date & time 05/06/13  1521 History  This chart was scribed for non-physician practitioner Georgeanna Harrison, working with Nelia Shi, MD by Dorothey Baseman, ED Scribe. This patient was seen in room WTR9/WTR9 and the patient's care was started at 4:16 PM.    Chief Complaint  Patient presents with  . Dental Pain    The history is provided by the patient. No language interpreter was used.   HPI Comments: Carl Orozco is a 52 y.o. male who presents to the Emergency Department complaining of left, lower dental pain that is exacerbated with eating and drinking. He states the pain has been progressively worsening and now extends over the left side of his face. Patient has been prescribed narcotic pain medications and was seen on 05/03/2013 and 04/17/2013 for similar complaints. Patient states that he has finished his antibiotic course. Patient reports that he has recently lost is Medicaid and can no longer afford his prescriptions or a dentist. Patient has dentures for his upper mouth. He reports that he may be allergic to Motrin and Tylenol with associated episodes of hives.   Past Medical History  Diagnosis Date  . Hypertension   . High cholesterol   . Arthritis    Past Surgical History  Procedure Laterality Date  . Back surgery     Family History  Problem Relation Age of Onset  . Hypertension Mother    History  Substance Use Topics  . Smoking status: Current Some Day Smoker -- 0.50 packs/day    Types: Cigarettes  . Smokeless tobacco: Never Used  . Alcohol Use: No    Review of Systems A complete 10 system review of systems was obtained and all systems are negative except as noted in the HPI and PMH.   Allergies  Aspirin and Nsaids  Home Medications   Current Outpatient Rx  Name  Route  Sig  Dispense  Refill  . albuterol (PROVENTIL HFA;VENTOLIN HFA) 108 (90 BASE) MCG/ACT inhaler   Inhalation   Inhale 2 puffs into the lungs every 6 (six)  hours as needed for wheezing or shortness of breath. For shortness of breath         . ezetimibe (ZETIA) 10 MG tablet   Oral   Take 1 tablet (10 mg total) by mouth every morning.   30 tablet   0   . fluticasone (FLONASE) 50 MCG/ACT nasal spray   Nasal   Place 2 sprays into the nose daily as needed for rhinitis or allergies.         Marland Kitchen HYDROcodone-acetaminophen (NORCO/VICODIN) 5-325 MG per tablet   Oral   Take 1 tablet by mouth every 4 (four) hours as needed for pain.   15 tablet   0   . HYDROcodone-acetaminophen (NORCO/VICODIN) 5-325 MG per tablet   Oral   Take 1 tablet by mouth every 6 (six) hours as needed for pain.   10 tablet   0   . simvastatin (ZOCOR) 10 MG tablet   Oral   Take 1 tablet (10 mg total) by mouth at bedtime.   30 tablet   0   . triamcinolone cream (KENALOG) 0.1 %   Topical   Apply 1 application topically daily as needed (rash).   30 g   3   . valsartan-hydrochlorothiazide (DIOVAN-HCT) 320-25 MG per tablet   Oral   Take 1 tablet by mouth daily.   30 tablet   2    Triage  Vitals: BP 129/87  Pulse 87  Temp(Src) 99.4 F (37.4 C) (Oral)  Resp 17  SpO2 100%  Physical Exam  Nursing note and vitals reviewed. Constitutional: He is oriented to person, place, and time. He appears well-developed and well-nourished. No distress.  HENT:  Head: Normocephalic and atraumatic.  Mouth/Throat: Uvula is midline, oropharynx is clear and moist and mucous membranes are normal. No trismus in the jaw. Abnormal dentition. No dental abscesses or edematous. No oropharyngeal exudate, posterior oropharyngeal edema, posterior oropharyngeal erythema or tonsillar abscesses.  Poor dental hygiene. Pt able to open and close mouth with out difficulty. Airway intact. Uvula midline. Mild gingival swelling with tenderness over affected area, but no fluctuance. No swelling or tenderness of submental and submandibular regions.  Eyes: Conjunctivae and EOM are normal.  Neck: Normal  range of motion and full passive range of motion without pain. Neck supple.  Cardiovascular: Normal rate, regular rhythm and normal heart sounds.   Pulmonary/Chest: Effort normal and breath sounds normal. No respiratory distress. He has no wheezes.  Musculoskeletal: Normal range of motion.  Lymphadenopathy:       Head (right side): No submental, no submandibular, no tonsillar, no preauricular and no posterior auricular adenopathy present.       Head (left side): No submental, no submandibular, no tonsillar, no preauricular and no posterior auricular adenopathy present.    He has no cervical adenopathy.  Neurological: He is alert and oriented to person, place, and time.  Skin: Skin is warm and dry. No rash noted. He is not diaphoretic.  Psychiatric: He has a normal mood and affect. His behavior is normal.    ED Course  Procedures (including critical care time)  DIAGNOSTIC STUDIES: Oxygen Saturation is 100% on room air, normal by my interpretation.    COORDINATION OF CARE: 4:21PM- Will discharge patient with a prescription for Ultram for pain management. Discussed that patient will not receive any narcotic pain medications today in the ED. Discussed treatment plan with patient at bedside and patient verbalized agreement.     Labs Review Labs Reviewed - No data to display Imaging Review No results found.  MDM  No diagnosis found. Patient with toothache.  No gross abscess.  Exam unconcerning for Ludwig's angina or spread of infection.  This is his third visit in the past 3 weeks for the same.  Patient has finished course of antibiotics.   Patient given prescription for Tramadol, but told that he will not be getting another prescription of Hydrocodone.  Urged patient to follow-up with dentist.  Patient given referral.     I personally performed the services described in this documentation, which was scribed in my presence. The recorded information has been reviewed and is  accurate.    Pascal Lux Bricelyn, PA-C 05/06/13 1640

## 2013-05-07 NOTE — ED Provider Notes (Signed)
Medical screening examination/treatment/procedure(s) were performed by non-physician practitioner and as supervising physician I was immediately available for consultation/collaboration.    Xana Bradt L Leilana Mcquire, MD 05/07/13 1502 

## 2013-05-12 ENCOUNTER — Emergency Department (HOSPITAL_COMMUNITY): Payer: Medicare Other

## 2013-05-12 ENCOUNTER — Encounter (HOSPITAL_COMMUNITY): Payer: Self-pay | Admitting: *Deleted

## 2013-05-12 ENCOUNTER — Emergency Department (HOSPITAL_COMMUNITY)
Admission: EM | Admit: 2013-05-12 | Discharge: 2013-05-12 | Disposition: A | Discharge status: Home / Self Care | Payer: Medicare Other | Attending: Emergency Medicine | Admitting: Emergency Medicine

## 2013-05-12 DIAGNOSIS — IMO0001 Reserved for inherently not codable concepts without codable children: Secondary | ICD-10-CM | POA: Insufficient documentation

## 2013-05-12 DIAGNOSIS — Z79899 Other long term (current) drug therapy: Secondary | ICD-10-CM | POA: Insufficient documentation

## 2013-05-12 DIAGNOSIS — I10 Essential (primary) hypertension: Secondary | ICD-10-CM | POA: Insufficient documentation

## 2013-05-12 DIAGNOSIS — E7800 Pure hypercholesterolemia, unspecified: Secondary | ICD-10-CM | POA: Insufficient documentation

## 2013-05-12 DIAGNOSIS — M129 Arthropathy, unspecified: Secondary | ICD-10-CM | POA: Insufficient documentation

## 2013-05-12 DIAGNOSIS — M7918 Myalgia, other site: Secondary | ICD-10-CM

## 2013-05-12 DIAGNOSIS — F17200 Nicotine dependence, unspecified, uncomplicated: Secondary | ICD-10-CM | POA: Insufficient documentation

## 2013-05-12 LAB — POCT I-STAT TROPONIN I: Troponin i, poc: 0.04 ng/mL (ref 0.00–0.08)

## 2013-05-12 LAB — BASIC METABOLIC PANEL
BUN: 16 mg/dL (ref 6–23)
CO2: 23 mEq/L (ref 19–32)
Chloride: 99 mEq/L (ref 96–112)
GFR calc non Af Amer: 75 mL/min — ABNORMAL LOW (ref >90)
Glucose, Bld: 97 mg/dL (ref 70–99)
Potassium: 4.2 mEq/L (ref 3.5–5.1)
Sodium: 133 mEq/L — ABNORMAL LOW (ref 135–145)

## 2013-05-12 LAB — URINALYSIS, ROUTINE W REFLEX MICROSCOPIC
Bilirubin Urine: NEGATIVE
Glucose, UA: NEGATIVE mg/dL
Hgb urine dipstick: NEGATIVE
Specific Gravity, Urine: 1.03 (ref 1.005–1.030)
pH: 6.5 (ref 5.0–8.0)

## 2013-05-12 MED ORDER — HYDROCODONE-ACETAMINOPHEN 5-325 MG PO TABS
1.0000 | ORAL_TABLET | Freq: Once | ORAL | Status: AC
  Administered 2013-05-12: 1 via ORAL
  Filled 2013-05-12: qty 1

## 2013-05-12 MED ORDER — OXYCODONE-ACETAMINOPHEN 5-325 MG PO TABS
1.0000 | ORAL_TABLET | ORAL | Status: DC | PRN
Start: 2013-05-12 — End: 2013-05-22

## 2013-05-12 NOTE — ED Notes (Signed)
Pt attempted to urinate but was unsuccessful at this time 

## 2013-05-12 NOTE — ED Notes (Signed)
Pt reports left flank pain x 2 days. Denies dysuria, nausea/vomiting, fever

## 2013-05-12 NOTE — ED Provider Notes (Signed)
CSN: [id number]     Arrival date & time 05/12/13  1249 History   First MD Initiated Contact with Patient 05/12/13 1252     No chief complaint on file.  (Consider location/radiation/quality/duration/timing/severity/associated sxs/prior Treatment) HPI This a 52 year old male with a history of hypertension hypercholesterolemia who presents with left-sided flank pain. Patient reports sharp stabbing pains in his left side the last 2-3 days. He denies any injury. He states he just woke up that way. It is been continuous. It is worse with certain movements. Patient is also concerned that he was told that his "kidneys didn't work quite right." He has not taken any medication at home. Currently he rates his pain a 9/10. He denies any headache, nausea, vomiting, chest pain, shortness of breath, abdominal pain, urinary symptoms, focal weakness or numbness. He denies any hematuria or dysuria. Past Medical History  Diagnosis Date  . Hypertension   . High cholesterol   . Arthritis    Past Surgical History  Procedure Laterality Date  . Back surgery     Family History  Problem Relation Age of Onset  . Hypertension Mother    History  Substance Use Topics  . Smoking status: Current Some Day Smoker -- 0.50 packs/day    Types: Cigarettes  . Smokeless tobacco: Never Used  . Alcohol Use: No    Review of Systems  Constitutional: Negative.  Negative for fever.  Respiratory: Negative.  Negative for chest tightness and shortness of breath.   Cardiovascular: Negative.  Negative for chest pain.  Gastrointestinal: Negative.  Negative for abdominal pain.  Genitourinary: Positive for flank pain. Negative for dysuria and urgency.  Musculoskeletal: Negative for back pain.  Neurological: Negative for headaches.  All other systems reviewed and are negative.    Allergies  Aspirin; Fish allergy; and Nsaids  Home Medications   Current Outpatient Rx  Name  Route  Sig  Dispense  Refill  . albuterol  (PROVENTIL HFA;VENTOLIN HFA) 108 (90 BASE) MCG/ACT inhaler   Inhalation   Inhale 2 puffs into the lungs every 6 (six) hours as needed for wheezing or shortness of breath. For shortness of breath         . ezetimibe (ZETIA) 10 MG tablet   Oral   Take 1 tablet (10 mg total) by mouth every morning.   30 tablet   0   . fluticasone (FLONASE) 50 MCG/ACT nasal spray   Nasal   Place 2 sprays into the nose daily as needed for rhinitis or allergies.         Marland Kitchen HYDROcodone-acetaminophen (NORCO/VICODIN) 5-325 MG per tablet   Oral   Take 1 tablet by mouth every 4 (four) hours as needed for pain.   15 tablet   0   . simvastatin (ZOCOR) 10 MG tablet   Oral   Take 1 tablet (10 mg total) by mouth at bedtime.   30 tablet   0   . traMADol (ULTRAM) 50 MG tablet   Oral   Take 1 tablet (50 mg total) by mouth every 6 (six) hours as needed for pain.   15 tablet   0   . triamcinolone cream (KENALOG) 0.1 %   Topical   Apply 1 application topically daily as needed (rash).   30 g   3   . valsartan-hydrochlorothiazide (DIOVAN-HCT) 320-25 MG per tablet   Oral   Take 1 tablet by mouth daily.   30 tablet   2   . oxyCODONE-acetaminophen (PERCOCET/ROXICET) 5-325 MG per tablet  Oral   Take 1 tablet by mouth every 4 (four) hours as needed for pain.   6 tablet   0    BP 131/91  Pulse 62  Temp(Src) 98.1 F (36.7 C) (Oral)  Resp 20  SpO2 100% Physical Exam  Nursing note and vitals reviewed. Constitutional: He is oriented to person, place, and time. He appears well-developed and well-nourished. No distress.  HENT:  Head: Normocephalic and atraumatic.  Neck: Neck supple.  Cardiovascular: Normal rate, regular rhythm and normal heart sounds.   No murmur heard. Pulmonary/Chest: Effort normal and breath sounds normal. No respiratory distress. He has no wheezes.  Abdominal: Soft. Bowel sounds are normal. There is no tenderness. There is no rebound.  Genitourinary:  No CVA tenderness.   Musculoskeletal: He exhibits no edema.  Tenderness to palpation over the left lateral ribs inferiorly.  Lymphadenopathy:    He has no cervical adenopathy.  Neurological: He is alert and oriented to person, place, and time.  Skin: Skin is warm and dry.  Psychiatric: He has a normal mood and affect.    ED Course  Procedures (including critical care time) Labs Review Labs Reviewed  BASIC METABOLIC PANEL - Abnormal; Notable for the following:    Sodium 133 (*)    GFR calc non Af Amer 75 (*)    GFR calc Af Amer 87 (*)    All other components within normal limits  URINALYSIS, ROUTINE W REFLEX MICROSCOPIC   EKG independently reviewed by myself: Normal sinus rhythm with a rate of 56, less than 1 mm of ST elevation in the inferior and lateral leads, this is a change from prior. This is likely secondary to early repolarization. Imaging Review No results found.  MDM   1. Musculoskeletal pain     this is a 52 year old male who presents with left-sided flank pain. He is nontoxic-appearing on exam and vital signs are within normal limits. Patient's pain is actually located over his left chest and flank. He has tenderness to palpation there and given that it is exacerbated with movement, his pain is most likely musculoskeletal in origin. Other considerations include kidney infection, kidney stone, will given that the location of the pain is over the lateral chest ACS. Patient has ACS risk factors but his exam is very atypical for ACS. Screening EKG was obtained.  EKG is somewhat changed from prior showing less than 1 mm of ST elevation in the inferior and lateral leads. Screening troponin and chest x-ray were obtained. Patient was given Percocet for his pain. Patient had improvement of his pain.  Troponin and xray neg.  After history, exam, and medical workup I feel the patient has been appropriately medically screened and is safe for discharge home. Pertinent diagnoses were discussed with the  patient. Patient was given return precautions.     Shon Baton, MD 05/12/13 [phone]

## 2013-05-22 ENCOUNTER — Encounter (HOSPITAL_COMMUNITY): Payer: Self-pay | Admitting: Emergency Medicine

## 2013-05-22 ENCOUNTER — Telehealth: Payer: Self-pay | Admitting: Family

## 2013-05-22 ENCOUNTER — Emergency Department (HOSPITAL_COMMUNITY)
Admission: EM | Admit: 2013-05-22 | Discharge: 2013-05-22 | Disposition: A | Discharge status: Home / Self Care | Payer: Medicare Other | Attending: Emergency Medicine | Admitting: Emergency Medicine

## 2013-05-22 ENCOUNTER — Ambulatory Visit (INDEPENDENT_AMBULATORY_CARE_PROVIDER_SITE_OTHER): Payer: Medicare Other | Admitting: Family

## 2013-05-22 ENCOUNTER — Encounter: Payer: Self-pay | Admitting: Family

## 2013-05-22 VITALS — BP 124/88 | HR 57 | Ht 70.5 in | Wt 246.0 lb

## 2013-05-22 DIAGNOSIS — K0889 Other specified disorders of teeth and supporting structures: Secondary | ICD-10-CM

## 2013-05-22 DIAGNOSIS — I10 Essential (primary) hypertension: Secondary | ICD-10-CM | POA: Insufficient documentation

## 2013-05-22 DIAGNOSIS — E7800 Pure hypercholesterolemia, unspecified: Secondary | ICD-10-CM

## 2013-05-22 DIAGNOSIS — K089 Disorder of teeth and supporting structures, unspecified: Secondary | ICD-10-CM | POA: Insufficient documentation

## 2013-05-22 DIAGNOSIS — M549 Dorsalgia, unspecified: Secondary | ICD-10-CM

## 2013-05-22 DIAGNOSIS — G8929 Other chronic pain: Secondary | ICD-10-CM

## 2013-05-22 DIAGNOSIS — Z79899 Other long term (current) drug therapy: Secondary | ICD-10-CM | POA: Insufficient documentation

## 2013-05-22 DIAGNOSIS — IMO0002 Reserved for concepts with insufficient information to code with codable children: Secondary | ICD-10-CM | POA: Insufficient documentation

## 2013-05-22 DIAGNOSIS — F17200 Nicotine dependence, unspecified, uncomplicated: Secondary | ICD-10-CM | POA: Insufficient documentation

## 2013-05-22 DIAGNOSIS — Z8739 Personal history of other diseases of the musculoskeletal system and connective tissue: Secondary | ICD-10-CM | POA: Insufficient documentation

## 2013-05-22 DIAGNOSIS — K029 Dental caries, unspecified: Secondary | ICD-10-CM | POA: Insufficient documentation

## 2013-05-22 MED ORDER — SIMVASTATIN 10 MG PO TABS: 10.0000 mg | ORAL_TABLET | Freq: Every day | ORAL | Status: AC

## 2013-05-22 MED ORDER — ALBUTEROL SULFATE HFA 108 (90 BASE) MCG/ACT IN AERS: 2.0000 | INHALATION_SPRAY | Freq: Four times a day (QID) | RESPIRATORY_TRACT | Status: DC | PRN

## 2013-05-22 MED ORDER — FLUTICASONE PROPIONATE 50 MCG/ACT NA SUSP: 2.0000 | Freq: Every day | NASAL | Status: AC | PRN

## 2013-05-22 MED ORDER — TRAMADOL HCL 50 MG PO TABS: 50.0000 mg | ORAL_TABLET | Freq: Three times a day (TID) | ORAL | Status: DC | PRN

## 2013-05-22 MED ORDER — EZETIMIBE 10 MG PO TABS: 10.0000 mg | ORAL_TABLET | Freq: Every morning | ORAL | Status: DC

## 2013-05-22 MED ORDER — VALSARTAN-HYDROCHLOROTHIAZIDE 320-25 MG PO TABS: 1.0000 | ORAL_TABLET | Freq: Every day | ORAL | Status: DC

## 2013-05-22 MED ORDER — CLINDAMYCIN HCL 150 MG PO CAPS: 300.0000 mg | ORAL_CAPSULE | Freq: Three times a day (TID) | ORAL | Status: DC

## 2013-05-22 NOTE — ED Provider Notes (Signed)
CSN: [id number]     Arrival date & time 05/22/13  1251 History  This chart was scribed for non-physician practitioner Carl Ledger, PA-C working with Ward Givens, MD by Valera Castle, ED scribe. This patient was seen in room WTR8/WTR8 and the patient's care was started at 2:10 PM.    Chief Complaint  Patient presents with  . Dental Pain  . left side pain    Patient is a 52 y.o. male presenting with tooth pain. The history is provided by the patient. No language interpreter was used.  Dental Pain Location:  Lower Severity:  Moderate Onset quality:  Gradual Duration:  1 month Timing:  Constant Progression:  Worsening Associated symptoms: no congestion, no drooling, no facial swelling, no fever, no headaches and no neck pain    HPI Comments: Carl Orozco is a 52 y.o. male with a h/o hypertension, high cholesterol, and arthritis who presents to the Emergency Department complaining of gradual, moderate, constant left lower toothache, onset 1 month ago.  He states he hasn't been able to eat and sleep normally due to severe pain. He reports having taken antibiotics about 1 month ago, with some relief.  He states that antibiotics helped initially but now his swelling and pain has returned.  He also reports using heat pads for the swelling and pain, with some relief. He reports that his PCP prescribed him pain medicine for his toothache today, but his insurance did not cover the medication because he has had multiple narcotic prescriptions from several providers.  He states he called his insurance company and now has approval and just needs a few tramadol until he can get his tooth removed.  He has plans for tooth extraction this weekend.  He denies fever, difficulty swallowing/breathing, sore throat, rhinorrhea, congestion, sore throat, cough, chest pain, SOB, abdominal pain, nausea, edema, and any other associated symptoms.       Past Medical History  Diagnosis Date  . Hypertension   . High  cholesterol   . Arthritis    Past Surgical History  Procedure Laterality Date  . Back surgery     Family History  Problem Relation Age of Onset  . Hypertension Mother    History  Substance Use Topics  . Smoking status: Current Some Day Smoker -- 0.50 packs/day    Types: Cigarettes  . Smokeless tobacco: Never Used  . Alcohol Use: No    Review of Systems  Constitutional: Negative for fever, chills, activity change, appetite change and fatigue.  HENT: Positive for dental problem (lower left pain.). Negative for ear pain, congestion, sore throat, facial swelling, rhinorrhea, drooling, trouble swallowing, neck pain, neck stiffness, voice change, sinus pressure and tinnitus.   Eyes: Negative for visual disturbance.  Respiratory: Negative for cough and shortness of breath.   Cardiovascular: Negative for chest pain.  Gastrointestinal: Negative for nausea, vomiting and abdominal pain.  Genitourinary: Negative for dysuria.  Musculoskeletal: Negative for back pain and gait problem.  Skin: Negative for color change and wound.  Neurological: Negative for headaches.  All other systems reviewed and are negative.    Allergies  Aspirin; Fish allergy; and Nsaids  Home Medications   Current Outpatient Rx  Name  Route  Sig  Dispense  Refill  . albuterol (PROVENTIL HFA;VENTOLIN HFA) 108 (90 BASE) MCG/ACT inhaler   Inhalation   Inhale 2 puffs into the lungs every 6 (six) hours as needed for wheezing or shortness of breath. For shortness of breath   1 Inhaler  5   . ezetimibe (ZETIA) 10 MG tablet   Oral   Take 1 tablet (10 mg total) by mouth every morning.   30 tablet   0   . fluticasone (FLONASE) 50 MCG/ACT nasal spray   Nasal   Place 2 sprays into the nose daily as needed for rhinitis or allergies.   16 g   5   . simvastatin (ZOCOR) 10 MG tablet   Oral   Take 1 tablet (10 mg total) by mouth at bedtime.   30 tablet   5   . traMADol (ULTRAM) 50 MG tablet   Oral   Take 1  tablet (50 mg total) by mouth every 8 (eight) hours as needed for pain.   60 tablet   0   . triamcinolone cream (KENALOG) 0.1 %   Topical   Apply 1 application topically daily as needed (rash).   30 g   3   . valsartan-hydrochlorothiazide (DIOVAN-HCT) 320-25 MG per tablet   Oral   Take 1 tablet by mouth daily.   30 tablet   5    Triage Vitals: BP 139/89  Pulse 74  Temp(Src) 98.9 F (37.2 C) (Oral)  Resp 16  SpO2 100%  Filed Vitals:   05/22/13 1304  BP: 139/89  Pulse: 74  Temp: 98.9 F (37.2 C)  TempSrc: Oral  Resp: 16  SpO2: 100%    Physical Exam  Nursing note and vitals reviewed. Constitutional: He is oriented to person, place, and time. He appears well-developed and well-nourished. No distress.  HENT:  Head: Normocephalic and atraumatic.  Right Ear: External ear normal.  Left Ear: External ear normal.  Nose: Nose normal.  Mouth/Throat: Oropharynx is clear and moist. No oropharyngeal exudate.    Dental cary present to the left lower incisor.  Mild erythema and edema to the surrounding gingival tissue.  No evidence of abscess or fluctuace. No trismus.  No tenderness to palpation to the jaw throughout.  No evidence of facial edema or masses.    Eyes: Conjunctivae and EOM are normal. Right eye exhibits no discharge. Left eye exhibits no discharge.  Neck: Neck supple. No tracheal deviation present.  No tenderness to palpation to the neck throughout.  No limitations with ROM.  No LAD.    Cardiovascular: Normal rate, regular rhythm and normal heart sounds.   Pulmonary/Chest: Effort normal and breath sounds normal. No respiratory distress. He has no wheezes. He has no rales.  Abdominal: Soft. There is no tenderness.  Musculoskeletal: Normal range of motion. He exhibits no edema and no tenderness.  Neurological: He is alert and oriented to person, place, and time.  Skin: Skin is warm and dry.  Psychiatric: He has a normal mood and affect. His behavior is normal.     ED Course  Procedures (including critical care time)  DIAGNOSTIC STUDIES: Oxygen Saturation is 100% on room air, normal by my interpretation.    COORDINATION OF CARE: 2:20 PM - Discussed treatment plan with pt at bedside and pt agreed to plan. Discussed insurance issues regarding multiple narcotic prescriptions with pt.   Labs Review Labs Reviewed - No data to display Imaging Review No results found.  MDM   1. Pain, dental      Earnie Bechard is a 52 y.o. male with a h/o hypertension, high cholesterol, and arthritis who presents to the Emergency Department complaining of gradual, moderate, constant upper left toothache, onset 1 month ago.  Pharmacy was called to confirm the patient's story.  Etiology of dental pain is likely due to a dental caries.  Patient had no evidence of an abscess to be drained at this time.  He had no neck/jaw edema or masses concerning for Ludwig's Angina.  He was afebrile, non-toxic in appearance, and remained in no acute distress. Patient was prescribed a few tramadol (5 tablets) to help with pain control until his tooth extraction this weekend.  He was also prescribed clindamycin since he has already failed penicillin management.  He has multiple visits for similar dental pain.  He has plans to have his tooth extracted this weekend.  He was encouraged to keep this appointment and was educated on proper use of the ED.  Patient was instructed to return to the ED if they experience any difficulty breathing/swallowing, fever, or other concerns.  Patient was in agreement with discharge and plan.     Final impressions: 1. Dental pain     Thomasenia Sales    I personally performed the services described in this documentation, which was scribed in my presence. The recorded information has been reviewed and is accurate.    Carl Ledger, PA-C 05/24/13 [phone]

## 2013-05-22 NOTE — Telephone Encounter (Signed)
Pt states that the Rx that he has received in the past month are from the ED or UC. Advised pt that I would let Padonda know this and give him a call back. Advised pt that if Padonda still refuses to authorize the refill, there is nothing else that I can do for him  Per Padodna, refill will not be authorized. It was flagged and rejected by the insurance company.

## 2013-05-22 NOTE — Telephone Encounter (Signed)
Pt aware.

## 2013-05-22 NOTE — Telephone Encounter (Signed)
Pt is requesting NP to call him back today. Pt would not elaborate the reason

## 2013-05-22 NOTE — Progress Notes (Signed)
Subjective:    Patient ID: Carl Orozco, male    DOB: 1961/07/25, 52 y.o.   MRN: [id number]  HPI 52 year old [place] American male, new patient to the practice and to be established. He has a history of chronic low back pain, hypertension, hyperlipidemia, and allergic rhinitis. He has been seen in the emergency department 6 times of the last 6 months with regards to chronic low back pain. He had been in the pain clinic previously but was instructed to find a new physician after his medication was not found in his system. He is requesting a referral to a new pain clinic. Has chronic low back pain as a result of a fall in 2008, where he fell down steps carrying furniture at work. 2011 he had surgery. The surgery is been ineffective. Reports his last physical being approximately one year ago. Has never had a colonoscopy screening. Isn't sure of his last cholesterol screen.   Review of Systems  Constitutional: Negative.   HENT: Negative.   Respiratory: Negative.   Cardiovascular: Negative.   Endocrine: Negative.   Genitourinary: Negative.   Musculoskeletal: Positive for back pain.  Skin: Negative.   Neurological: Negative.   Hematological: Negative.   Psychiatric/Behavioral: Negative.    Past Medical History  Diagnosis Date  . Hypertension   . High cholesterol   . Arthritis     History   Social History  . Marital Status: Single    Spouse Name: N/A    Number of Children: N/A  . Years of Education: N/A   Occupational History  . Not on file.   Social History Main Topics  . Smoking status: Current Some Day Smoker -- 0.50 packs/day    Types: Cigarettes  . Smokeless tobacco: Never Used  . Alcohol Use: No  . Drug Use: No  . Sexual Activity: Not on file   Other Topics Concern  . Not on file   Social History Narrative  . No narrative on file    Past Surgical History  Procedure Laterality Date  . Back surgery      Family History  Problem Relation Age of Onset  .  Hypertension Mother     Allergies  Allergen Reactions  . Aspirin Hives  . Fish Allergy Hives  . Nsaids Hives    Current Outpatient Prescriptions on File Prior to Visit  Medication Sig Dispense Refill  . albuterol (PROVENTIL HFA;VENTOLIN HFA) 108 (90 BASE) MCG/ACT inhaler Inhale 2 puffs into the lungs every 6 (six) hours as needed for wheezing or shortness of breath. For shortness of breath      . ezetimibe (ZETIA) 10 MG tablet Take 1 tablet (10 mg total) by mouth every morning.  30 tablet  0  . fluticasone (FLONASE) 50 MCG/ACT nasal spray Place 2 sprays into the nose daily as needed for rhinitis or allergies.      . simvastatin (ZOCOR) 10 MG tablet Take 1 tablet (10 mg total) by mouth at bedtime.  30 tablet  0  . triamcinolone cream (KENALOG) 0.1 % Apply 1 application topically daily as needed (rash).  30 g  3  . valsartan-hydrochlorothiazide (DIOVAN-HCT) 320-25 MG per tablet Take 1 tablet by mouth daily.  30 tablet  2   No current facility-administered medications on file prior to visit.    BP 124/88  Pulse 57  Ht 5' 10.5" (1.791 m)  Wt 246 lb (111.585 kg)  BMI 34.79 kg/m2chart    Objective:   Physical Exam  Constitutional: He is oriented  to person, place, and time. He appears well-developed and well-nourished.  Neck: Normal range of motion. Neck supple. No thyromegaly present.  Cardiovascular: Normal rate, regular rhythm and normal heart sounds.   Pulmonary/Chest: Effort normal and breath sounds normal.  Abdominal: Soft. Bowel sounds are normal.  Musculoskeletal: He exhibits tenderness. He exhibits no edema.  Tenderness to palpation of the upper lumbar spine. Pain with straight leg raise maneuver. Pain rotation. Patient ambulating with a cane. Has a back brace on.  Neurological: He is alert and oriented to person, place, and time. He has normal reflexes. He displays normal reflexes. No cranial nerve deficit. Coordination normal.  Skin: Skin is warm and dry.  Psychiatric: He  has a normal mood and affect.          Assessment & Plan:  Assessment: 1. Chronic low back pain 2. Hypertension 3. Hyperlipidemia  Plan: Refer to pain management. Tramadol 1 tablet every 8 hours as needed for pain. Return for complete physical exam within 4 weeks. Low back strengthening exercises provided. Encouraged healthy diet and exercise.

## 2013-05-22 NOTE — ED Notes (Signed)
Per pt, upper left toothache since beginning of month-also left side pain-has been worked up several times for the same complaints-

## 2013-05-22 NOTE — Telephone Encounter (Signed)
Pt's Insurance has notified the  pharmacy that patient has had this medication prescribed and filled by multiple providers within the last month.  Pharmacy will not fill the recent prescription sent in by Carl Orozco until she gets a verbal approval to due so.  Please contact [name] at [facility] at [phone] to discuss.

## 2013-05-22 NOTE — Telephone Encounter (Signed)
DON NOT FILL, per Padonda. Pharmacist is aware and Rx has been voided, per pharmacist [name].  Pt is aware that Rx will not be authorized to fill by [facility]

## 2013-05-22 NOTE — Patient Instructions (Addendum)
Back Exercises These exercises may help you when beginning to rehabilitate your injury. Your symptoms may resolve with or without further involvement from your physician, physical therapist or athletic trainer. While completing these exercises, remember:   Restoring tissue flexibility helps normal motion to return to the joints. This allows healthier, less painful movement and activity.  An effective stretch should be held for at least 30 seconds.  A stretch should never be painful. You should only feel a gentle lengthening or release in the stretched tissue. STRETCH  Extension, Prone on Elbows   Lie on your stomach on the floor, a bed will be too soft. Place your palms about shoulder width apart and at the height of your head.  Place your elbows under your shoulders. If this is too painful, stack pillows under your chest.  Allow your body to relax so that your hips drop lower and make contact more completely with the floor.  Hold this position for __________ seconds.  Slowly return to lying flat on the floor. Repeat __________ times. Complete this exercise __________ times per day.  RANGE OF MOTION  Extension, Prone Press Ups   Lie on your stomach on the floor, a bed will be too soft. Place your palms about shoulder width apart and at the height of your head.  Keeping your back as relaxed as possible, slowly straighten your elbows while keeping your hips on the floor. You may adjust the placement of your hands to maximize your comfort. As you gain motion, your hands will come more underneath your shoulders.  Hold this position __________ seconds.  Slowly return to lying flat on the floor. Repeat __________ times. Complete this exercise __________ times per day.  RANGE OF MOTION- Quadruped, Neutral Spine   Assume a hands and knees position on a firm surface. Keep your hands under your shoulders and your knees under your hips. You may place padding under your knees for comfort.  Drop  your head and point your tail bone toward the ground below you. This will round out your low back like an angry cat. Hold this position for __________ seconds.  Slowly lift your head and release your tail bone so that your back sags into a large arch, like an old horse.  Hold this position for __________ seconds.  Repeat this until you feel limber in your low back.  Now, find your "sweet spot." This will be the most comfortable position somewhere between the two previous positions. This is your neutral spine. Once you have found this position, tense your stomach muscles to support your low back.  Hold this position for __________ seconds. Repeat __________ times. Complete this exercise __________ times per day.  STRETCH  Flexion, Single Knee to Chest   Lie on a firm bed or floor with both legs extended in front of you.  Keeping one leg in contact with the floor, bring your opposite knee to your chest. Hold your leg in place by either grabbing behind your thigh or at your knee.  Pull until you feel a gentle stretch in your low back. Hold __________ seconds.  Slowly release your grasp and repeat the exercise with the opposite side. Repeat __________ times. Complete this exercise __________ times per day.  STRETCH - Hamstrings, Standing  Stand or sit and extend your right / left leg, placing your foot on a chair or foot stool  Keeping a slight arch in your low back and your hips straight forward.  Lead with your chest and   lean forward at the waist until you feel a gentle stretch in the back of your right / left knee or thigh. (When done correctly, this exercise requires leaning only a small distance.)  Hold this position for __________ seconds. Repeat __________ times. Complete this stretch __________ times per day. STRENGTHENING  Deep Abdominals, Pelvic Tilt   Lie on a firm bed or floor. Keeping your legs in front of you, bend your knees so they are both pointed toward the ceiling and  your feet are flat on the floor.  Tense your lower abdominal muscles to press your low back into the floor. This motion will rotate your pelvis so that your tail bone is scooping upwards rather than pointing at your feet or into the floor.  With a gentle tension and even breathing, hold this position for __________ seconds. Repeat __________ times. Complete this exercise __________ times per day.  STRENGTHENING  Abdominals, Crunches   Lie on a firm bed or floor. Keeping your legs in front of you, bend your knees so they are both pointed toward the ceiling and your feet are flat on the floor. Cross your arms over your chest.  Slightly tip your chin down without bending your neck.  Tense your abdominals and slowly lift your trunk high enough to just clear your shoulder blades. Lifting higher can put excessive stress on the low back and does not further strengthen your abdominal muscles.  Control your return to the starting position. Repeat __________ times. Complete this exercise __________ times per day.  STRENGTHENING  Quadruped, Opposite UE/LE Lift   Assume a hands and knees position on a firm surface. Keep your hands under your shoulders and your knees under your hips. You may place padding under your knees for comfort.  Find your neutral spine and gently tense your abdominal muscles so that you can maintain this position. Your shoulders and hips should form a rectangle that is parallel with the floor and is not twisted.  Keeping your trunk steady, lift your right hand no higher than your shoulder and then your left leg no higher than your hip. Make sure you are not holding your breath. Hold this position __________ seconds.  Continuing to keep your abdominal muscles tense and your back steady, slowly return to your starting position. Repeat with the opposite arm and leg. Repeat __________ times. Complete this exercise __________ times per day. Document Released: 09/02/2005 Document  Revised: 11/07/2011 Document Reviewed: 11/27/2008 [employer] Patient Information 2014 [employer].  

## 2013-05-22 NOTE — Addendum Note (Signed)
Addended byAdline Mango B on: 05/22/2013 09:38 AM   Modules accepted: Orders

## 2013-05-25 NOTE — ED Provider Notes (Signed)
Medical screening examination/treatment/procedure(s) were performed by non-physician practitioner and as supervising physician I was immediately available for consultation/collaboration. Devoria Albe, MD, [facility]   Ward Givens, MD 05/25/13 [phone]

## 2013-05-29 ENCOUNTER — Telehealth: Payer: Self-pay | Admitting: Family

## 2013-05-29 MED ORDER — VALSARTAN-HYDROCHLOROTHIAZIDE 320-25 MG PO TABS: 1.0000 | ORAL_TABLET | Freq: Every day | ORAL | Status: DC

## 2013-05-29 NOTE — Telephone Encounter (Signed)
Rx resent to pharmacy and pt aware.  Pt also aware that original pain clinic referral denied him, however Joni Reining resubmitted referral to another location and she is waiting to hear back from them

## 2013-05-29 NOTE — Telephone Encounter (Addendum)
Pt states he does not have the RX for valsartan-hydrochlorothiazide (DIOVAN-HCT) 320-25 MG per tablet Pt thinks he did not receive when he left.  [facility] you [facility] this [facility] into [facility] [address]/ day w/ 5 refills  Also pt would like to know what is going on w/ his referral to pain management clinic? Pls advise.

## 2013-06-19 ENCOUNTER — Encounter: Payer: Medicare Other | Admitting: Family

## 2013-06-27 ENCOUNTER — Other Ambulatory Visit: Payer: Self-pay | Admitting: Family

## 2013-08-24 ENCOUNTER — Encounter (HOSPITAL_COMMUNITY): Payer: Self-pay | Admitting: Emergency Medicine

## 2013-08-24 ENCOUNTER — Emergency Department (HOSPITAL_COMMUNITY): Payer: Medicare Other

## 2013-08-24 ENCOUNTER — Emergency Department (HOSPITAL_COMMUNITY)
Admission: EM | Admit: 2013-08-24 | Discharge: 2013-08-24 | Disposition: A | Discharge status: Home / Self Care | Payer: Medicare Other | Attending: Emergency Medicine | Admitting: Emergency Medicine

## 2013-08-24 DIAGNOSIS — F17200 Nicotine dependence, unspecified, uncomplicated: Secondary | ICD-10-CM | POA: Insufficient documentation

## 2013-08-24 DIAGNOSIS — Z79899 Other long term (current) drug therapy: Secondary | ICD-10-CM | POA: Insufficient documentation

## 2013-08-24 DIAGNOSIS — K4090 Unilateral inguinal hernia, without obstruction or gangrene, not specified as recurrent: Secondary | ICD-10-CM | POA: Insufficient documentation

## 2013-08-24 DIAGNOSIS — M129 Arthropathy, unspecified: Secondary | ICD-10-CM | POA: Insufficient documentation

## 2013-08-24 DIAGNOSIS — IMO0002 Reserved for concepts with insufficient information to code with codable children: Secondary | ICD-10-CM | POA: Insufficient documentation

## 2013-08-24 DIAGNOSIS — E7800 Pure hypercholesterolemia, unspecified: Secondary | ICD-10-CM | POA: Insufficient documentation

## 2013-08-24 DIAGNOSIS — I10 Essential (primary) hypertension: Secondary | ICD-10-CM | POA: Insufficient documentation

## 2013-08-24 MED ORDER — OXYCODONE-ACETAMINOPHEN 5-325 MG PO TABS
2.0000 | ORAL_TABLET | Freq: Once | ORAL | Status: AC
  Administered 2013-08-24: 2 via ORAL
  Filled 2013-08-24: qty 2

## 2013-08-24 NOTE — ED Provider Notes (Signed)
CSN: [id number]     Arrival date & time 08/24/13  1127 History   First MD Initiated Contact with Patient 08/24/13 1249     Chief Complaint  Patient presents with  . Hernia    HPI Patient reports worsening right inguinal hernia for the past several weeks.  No nausea or vomiting.  Some increased discomfort around the site itself.  He saw his primary care physician he stated that he likely needed this surgically repaired.  He continues to have discomfort and pain in his right groin.  Obvious right bulge noted in his right inguinal region.  He denies penile or testicular pain.  Symptoms are mild in severity.  He takes Vicodin at home for chronic pain reports this is not helping with his right groin pain.  Normal flatus   Past Medical History  Diagnosis Date  . Hypertension   . High cholesterol   . Arthritis    Past Surgical History  Procedure Laterality Date  . Back surgery     Family History  Problem Relation Age of Onset  . Hypertension Mother    History  Substance Use Topics  . Smoking status: Current Some Day Smoker -- 0.50 packs/day    Types: Cigarettes  . Smokeless tobacco: Never Used  . Alcohol Use: No    Review of Systems  All other systems reviewed and are negative.    Allergies  Aspirin; Fish allergy; and Nsaids  Home Medications   Current Outpatient Rx  Name  Route  Sig  Dispense  Refill  . albuterol (PROVENTIL HFA;VENTOLIN HFA) 108 (90 BASE) MCG/ACT inhaler   Inhalation   Inhale 2 puffs into the lungs every 6 (six) hours as needed for wheezing or shortness of breath. For shortness of breath   1 Inhaler   5   . ezetimibe (ZETIA) 10 MG tablet   Oral   Take 10 mg by mouth daily.         . fluticasone (FLONASE) 50 MCG/ACT nasal spray   Nasal   Place 2 sprays into the nose daily as needed for rhinitis or allergies.   16 g   5   . HYDROcodone-acetaminophen (NORCO/VICODIN) 5-325 MG per tablet   Oral   Take 1 tablet by mouth 2 (two) times daily as  needed for moderate pain or severe pain. Filled 9/11 for 10 tablets.         . simvastatin (ZOCOR) 10 MG tablet   Oral   Take 1 tablet (10 mg total) by mouth at bedtime.   30 tablet   5   . triamcinolone cream (KENALOG) 0.1 %   Topical   Apply 1 application topically daily as needed (rash).   30 g   3   . valsartan-hydrochlorothiazide (DIOVAN-HCT) 320-25 MG per tablet   Oral   Take 1 tablet by mouth daily.          BP 141/79  Pulse 76  Temp(Src) 99 F (37.2 C) (Oral)  Resp 16  SpO2 97% Physical Exam  Nursing note and vitals reviewed. Constitutional: He is oriented to person, place, and time. He appears well-developed and well-nourished.  HENT:  Head: Normocephalic and atraumatic.  Eyes: EOM are normal.  Neck: Normal range of motion.  Cardiovascular: Normal rate, regular rhythm, normal heart sounds and intact distal pulses.   Pulmonary/Chest: Effort normal and breath sounds normal. No respiratory distress.  Abdominal: Soft. He exhibits no distension. There is no tenderness.  Right inguinal region with small swelling.  Easily reducible right inguinal hernia.  No overlying skin changes or erythema or warmth.  Musculoskeletal: Normal range of motion.  Neurological: He is alert and oriented to person, place, and time.  Skin: Skin is warm and dry.  Psychiatric: He has a normal mood and affect. Judgment normal.    ED Course  Procedures (including critical care time) Labs Review Labs Reviewed - No data to display Imaging Review Dg Abd 2 Views  08/24/2013   CLINICAL DATA:  Right side abdominal pain for 3 weeks.  EXAM: ABDOMEN - 2 VIEW  COMPARISON:  None.  FINDINGS: The bowel gas pattern is normal. There is no evidence of free air. No radio-opaque calculi or other significant radiographic abnormality is seen. 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  IMPRESSION: No acute finding.   Electronically Signed   By: Drusilla Kanner M.D.   On: 08/24/2013 14:09  I personally reviewed the  imaging tests through PACS system I reviewed available ER/hospitalization records through the EMR   EKG Interpretation   None       MDM   1. Right inguinal hernia    Reducible right inguinal hernia.  No evidence of bowel obstruction.  Doubt strangulation.  Discharge home with instructions in no heavy lifting and minimizing Valsalva.  Stool softeners recommended.  General surgery followup.    Lyanne Co, MD 08/24/13 [id number]

## 2013-08-24 NOTE — ED Notes (Signed)
Pt states hx of inguinal hernia. States that he went to his doctor and was told that his hernia was not reducible anymore.  Recommended surgery immediately. Was told to come here on Tuesday.

## 2013-08-31 ENCOUNTER — Encounter (HOSPITAL_COMMUNITY): Payer: Self-pay | Admitting: Emergency Medicine

## 2013-08-31 ENCOUNTER — Emergency Department (HOSPITAL_COMMUNITY)
Admission: EM | Admit: 2013-08-31 | Discharge: 2013-08-31 | Disposition: A | Discharge status: Home / Self Care | Payer: Medicare FFS | Attending: Emergency Medicine | Admitting: Emergency Medicine

## 2013-08-31 DIAGNOSIS — M129 Arthropathy, unspecified: Secondary | ICD-10-CM | POA: Insufficient documentation

## 2013-08-31 DIAGNOSIS — I10 Essential (primary) hypertension: Secondary | ICD-10-CM | POA: Insufficient documentation

## 2013-08-31 DIAGNOSIS — E7800 Pure hypercholesterolemia, unspecified: Secondary | ICD-10-CM | POA: Insufficient documentation

## 2013-08-31 DIAGNOSIS — R509 Fever, unspecified: Secondary | ICD-10-CM | POA: Insufficient documentation

## 2013-08-31 DIAGNOSIS — K4090 Unilateral inguinal hernia, without obstruction or gangrene, not specified as recurrent: Secondary | ICD-10-CM | POA: Insufficient documentation

## 2013-08-31 DIAGNOSIS — Z79899 Other long term (current) drug therapy: Secondary | ICD-10-CM | POA: Insufficient documentation

## 2013-08-31 DIAGNOSIS — F17200 Nicotine dependence, unspecified, uncomplicated: Secondary | ICD-10-CM | POA: Insufficient documentation

## 2013-08-31 MED ORDER — OXYCODONE-ACETAMINOPHEN 5-325 MG PO TABS: 1.0000 | ORAL_TABLET | ORAL | Status: DC | PRN

## 2013-08-31 NOTE — ED Notes (Signed)
Seen here last week for same, told to call surgeon, has appt on Monday, hydrocodone was making him itch and not feel well, so not taking, needs more pain medicine to get through until see surgeon

## 2013-08-31 NOTE — ED Provider Notes (Signed)
CSN: [id number]     Arrival date & time 08/31/13  1744 History  This chart was scribed for non-physician practitioner, Elpidio AnisShari Adelard Sanon, PA-C,working with Layla MawKristen N Ward, DO, by Karle PlumberJennifer Tensley, ED Scribe.  This patient was seen in room WTR6/WTR6 and the patient's care was started at 7:25 PM.  Chief Complaint  Patient presents with  . Hernia   The history is provided by the patient. No language interpreter was used.   HPI Comments:  Carl Orozco is a 53 y.o. male who presents to the Emergency Department complaining of a hernia. Pt states he was seen here last week for the same issue and was referred to a surgeon. He states he was given Norco, but he states it made him itch and break out. Pt states he flushed the medication because he did not want to take it. Pt states he has appt with surgeon in two days. Pt denies bowel issues, nausea, or vomiting.    Past Medical History  Diagnosis Date  . Hypertension   . High cholesterol   . Arthritis    Past Surgical History  Procedure Laterality Date  . Back surgery     Family History  Problem Relation Age of Onset  . Hypertension Mother    History  Substance Use Topics  . Smoking status: Current Some Day Smoker -- 0.50 packs/day    Types: Cigarettes  . Smokeless tobacco: Never Used  . Alcohol Use: No    Review of Systems  Constitutional: Positive for fever.  Gastrointestinal: Positive for abdominal pain (tender hernia).  All other systems reviewed and are negative.    Allergies  Aspirin; Fish allergy; and Nsaids  Home Medications   Current Outpatient Rx  Name  Route  Sig  Dispense  Refill  . albuterol (PROVENTIL HFA;VENTOLIN HFA) 108 (90 BASE) MCG/ACT inhaler   Inhalation   Inhale 2 puffs into the lungs every 6 (six) hours as needed for wheezing or shortness of breath. For shortness of breath   1 Inhaler   5   . ezetimibe (ZETIA) 10 MG tablet   Oral   Take 10 mg by mouth daily.         . fluticasone (FLONASE) 50  MCG/ACT nasal spray   Nasal   Place 2 sprays into the nose daily as needed for rhinitis or allergies.   16 g   5   . HYDROcodone-acetaminophen (NORCO/VICODIN) 5-325 MG per tablet   Oral   Take 1 tablet by mouth 2 (two) times daily as needed for moderate pain or severe pain. Filled 9/11 for 10 tablets.         . simvastatin (ZOCOR) 10 MG tablet   Oral   Take 1 tablet (10 mg total) by mouth at bedtime.   30 tablet   5   . triamcinolone cream (KENALOG) 0.1 %   Topical   Apply 1 application topically daily as needed (rash).   30 g   3   . valsartan-hydrochlorothiazide (DIOVAN-HCT) 320-25 MG per tablet   Oral   Take 1 tablet by mouth daily.          Triage Vitals: BP 138/85  Pulse 96  Temp(Src) 99.5 F (37.5 C) (Oral)  Resp 20  SpO2 97% Physical Exam  Nursing note and vitals reviewed. Constitutional: He is oriented to person, place, and time. He appears well-developed and well-nourished.  HENT:  Head: Normocephalic and atraumatic.  Eyes: EOM are normal.  Neck: Normal range of motion.  Pulmonary/Chest:  Effort normal.  Abdominal:  Right inguinal hernia. Tender, soft. Reducible.   Musculoskeletal: Normal range of motion.  Neurological: He is alert and oriented to person, place, and time.  Skin: Skin is warm and dry.  Psychiatric: He has a normal mood and affect. His behavior is normal.    ED Course  Procedures (including critical care time) DIAGNOSTIC STUDIES: Oxygen Saturation is 97% on RA, normal by my interpretation.   COORDINATION OF CARE: 7:28 PM- Will write for a new pain medication. Advised pt to make sure he keeps appt on Monday with surgeon. Pt verbalizes understanding and agrees to plan.  Medications - No data to display  Labs Review Labs Reviewed - No data to display Imaging Review No results found.  EKG Interpretation   None       MDM  No diagnosis found. 1. Inguinal hernia 2. Uncontrolled pain  Hernia remains reducible, soft. He  states Norco, given to him on earlier ED visit is not controlling pain. He has an appointment with surgery on Monday, January 5th. Stable for discharge.   I personally performed the services described in this documentation, which was scribed in my presence. The recorded information has been reviewed and is accurate.     Arnoldo Hooker, PA-C 08/31/13 1937

## 2013-08-31 NOTE — Discharge Instructions (Signed)

## 2013-08-31 NOTE — ED Provider Notes (Signed)
Medical screening examination/treatment/procedure(s) were performed by non-physician practitioner and as supervising physician I was immediately available for consultation/collaboration.  EKG Interpretation   None         Kristen N Ward, DO 08/31/13 2350 

## 2013-09-02 ENCOUNTER — Encounter (INDEPENDENT_AMBULATORY_CARE_PROVIDER_SITE_OTHER): Payer: Self-pay | Admitting: General Surgery

## 2013-09-02 ENCOUNTER — Ambulatory Visit (INDEPENDENT_AMBULATORY_CARE_PROVIDER_SITE_OTHER): Payer: Medicare HMO | Admitting: General Surgery

## 2013-09-02 VITALS — BP 138/82 | HR 72 | Temp 98.5°F | Resp 18 | Ht 70.5 in | Wt 252.0 lb

## 2013-09-02 DIAGNOSIS — K4090 Unilateral inguinal hernia, without obstruction or gangrene, not specified as recurrent: Secondary | ICD-10-CM

## 2013-09-02 NOTE — Progress Notes (Signed)
Patient ID: Carl Orozco, male   DOB: 12-28-1960, 53 y.o.   MRN: [id number]  Chief Complaint  Patient presents with  . Inguinal Hernia    HPI Carl Orozco is a 53 y.o. male.  He is referred by Dr. Azalia Bilis in the emergency department for evaluation and management of a symptomatic right inguinal hernia. He states that Helyn App, MD at [facility] is his PCP and manages his back pain and prescription medication. He has been seen at [facility] in the past by Junius Argyle, FNP as well.   He has noticed a painful bulge in his right groin for about 6 months. This is getting worse. No obvious incarceration. No prior history of hernia repair her hernia surgery.  Comorbidities include disability, permanent, for back pain. He's had lumbar disc surgery by Dr. Alveda Reasons through a left retroperitoneal approach. He is seen in the emergency room frequently for back pain. He has allergic rhinitis and uses albuterol inhalers. He has hypertension and hyperlipidemia.  He lives alone. He is able to drive a car. He smokes a half a pack of cigarettes per day. Denies alcohol use. HPI  Past Medical History  Diagnosis Date  . Hypertension   . High cholesterol   . Arthritis   . Neuromuscular disorder     Past Surgical History  Procedure Laterality Date  . Back surgery      Family History  Problem Relation Age of Onset  . Hypertension Mother     Social History History  Substance Use Topics  . Smoking status: Current Some Day Smoker -- 0.50 packs/day    Types: Cigarettes  . Smokeless tobacco: Never Used  . Alcohol Use: No    Allergies  Allergen Reactions  . Aspirin Hives  . Fish Allergy Hives  . Nsaids Hives    Current Outpatient Prescriptions  Medication Sig Dispense Refill  . albuterol (PROVENTIL HFA;VENTOLIN HFA) 108 (90 BASE) MCG/ACT inhaler Inhale 2 puffs into the lungs every 6 (six) hours as needed for wheezing or shortness of breath. For shortness of breath  1  Inhaler  5  . ezetimibe (ZETIA) 10 MG tablet Take 10 mg by mouth daily.      . fluticasone (FLONASE) 50 MCG/ACT nasal spray Place 2 sprays into the nose daily as needed for rhinitis or allergies.  16 g  5  . HYDROcodone-acetaminophen (NORCO/VICODIN) 5-325 MG per tablet Take 1 tablet by mouth 2 (two) times daily as needed for moderate pain or severe pain. Filled 9/11 for 10 tablets.      . simvastatin (ZOCOR) 10 MG tablet Take 1 tablet (10 mg total) by mouth at bedtime.  30 tablet  5  . valsartan-hydrochlorothiazide (DIOVAN-HCT) 320-25 MG per tablet Take 1 tablet by mouth daily.      Marland Kitchen oxyCODONE-acetaminophen (PERCOCET/ROXICET) 5-325 MG per tablet Take 1-2 tablets by mouth every 4 (four) hours as needed for severe pain.  20 tablet  0   No current facility-administered medications for this visit.    Review of Systems Review of Systems  Constitutional: Negative for fever, chills and unexpected weight change.  HENT: Positive for rhinorrhea and sinus pressure. Negative for congestion, hearing loss, sore throat, trouble swallowing and voice change.   Eyes: Negative for visual disturbance.  Respiratory: Positive for cough. Negative for wheezing.   Cardiovascular: Negative for chest pain, palpitations and leg swelling.  Gastrointestinal: Negative for nausea, vomiting, abdominal pain, diarrhea, constipation, blood in stool, abdominal distention, anal bleeding and rectal pain.  Genitourinary: Negative for hematuria and difficulty urinating.  Musculoskeletal: Positive for back pain and gait problem. Negative for arthralgias.       Walks with a cane. Has been followed by a pain clinic in the past, but now states his PCP takes care of his chronic pain management.  Skin: Negative for rash and wound.  Neurological: Negative for seizures, syncope, weakness and headaches.  Hematological: Negative for adenopathy. Does not bruise/bleed easily.  Psychiatric/Behavioral: Negative for confusion.    Blood  pressure 138/82, pulse 72, temperature 98.5 F (36.9 C), temperature source Oral, resp. rate 18, height 5' 10.5" (1.791 m), weight 252 lb (114.306 kg).  Physical Exam Physical Exam  Constitutional: He is oriented to person, place, and time. He appears well-developed and well-nourished. No distress.  HENT:  Head: Normocephalic.  Nose: Nose normal.  Mouth/Throat: No oropharyngeal exudate.  Eyes: Conjunctivae and EOM are normal. Pupils are equal, round, and reactive to light. Right eye exhibits no discharge. Left eye exhibits no discharge. No scleral icterus.  Extraocular movements are full on direct confrontation. At rest he has a disconjugate gaze with lateral deviation of the left.  Neck: Normal range of motion. Neck supple. No JVD present. No tracheal deviation present. No thyromegaly present.  Cardiovascular: Normal rate, regular rhythm, normal heart sounds and intact distal pulses.   No murmur heard. Pulmonary/Chest: Effort normal and breath sounds normal. No stridor. No respiratory distress. He has no wheezes. He has no rales. He exhibits no tenderness.  Abdominal: Soft. Bowel sounds are normal. He exhibits no distension and no mass. There is no tenderness. There is no rebound and no guarding.    Overweight with small panniculus. Small nonreducible umbilical hernia, nontender.  Genitourinary:  Moderate size right inguinal hernia, tender, basically reducible when supine. Does not extend past the external ring. No evidence of hernia in the left inguinal or femoral position. Penis scrotum and testes are normal. No scrotal mass.  Musculoskeletal: Normal range of motion. He exhibits no edema and no tenderness.  Lymphadenopathy:    He has no cervical adenopathy.  Neurological: He is alert and oriented to person, place, and time. He has normal reflexes. Coordination normal.  Skin: Skin is warm and dry. No rash noted. He is not diaphoretic. No erythema. No pallor.  Psychiatric: He has a normal  mood and affect. His behavior is normal. Judgment and thought content normal.    Data Reviewed Emergency department records. [facility] records.  Assessment    Right inguinal hernia, very symptomatic, reducible  Umbilical hernia, small, asymptomatic  History left retroperitoneal lumbar disc surgery.  Chronic back pain resulting in permanent disability And chronic pain  Hypertension  Hyperlipidemia  Allergic rhinitis     Plan    He would like to proceed with repair of his right inguinal hernia immediately. I think that is appropriate, given his symptoms.  Scheduled for open repair right inguinal hernia with mesh.  I discussed the indications, details, techniques, and numerous risk of the surgery with him. He is aware of the risk of bleeding, infection, recurrence of the hernia, nerve damage with chronic pain or numbness, damage to the testicle or intestine or bladder with major reconstructive surgery. At this time all his questions were answered and he understands all these issues. He agrees with this plan.       Angelia Mould. Derrell Lolling, M.D., [facility] General and Minimally invasive Surgery Breast and Colorectal Surgery Office:   [phone] Pager:   [phone]  09/02/2013, 3:00 PM

## 2013-09-02 NOTE — Patient Instructions (Signed)
You have a right inguinal hernia. This is the source of your pain and a bulge.  You have stated that you would like to proceed with surgery in the near future.  You will be scheduled for open repair of the right inguinal hernia with mesh in the near future.      Inguinal Hernia, Adult  Care After Refer to this sheet in the next few weeks. These discharge instructions provide you with general information on caring for yourself after you leave the hospital. Your caregiver may also give you specific instructions. Your treatment has been planned according to the most current medical practices available, but unavoidable complications sometimes occur. If you have any problems or questions after discharge, please call your caregiver. HOME CARE INSTRUCTIONS  Put ice on the operative site.  Put ice in a plastic bag.  Place a towel between your skin and the bag.  Leave the ice on for 15-20 minutes at a time, 03-04 times a day while awake.  Change bandages (dressings) as directed.  Keep the wound dry and clean. The wound may be washed gently with soap and water. Gently blot or dab the wound dry. It is okay to take showers 24 to 48 hours after surgery. Do not take baths, use swimming pools, or use hot tubs for 10 days, or as directed by your caregiver.  Only take over-the-counter or prescription medicines for pain, discomfort, or fever as directed by your caregiver.  Continue your normal diet as directed.  Do not lift anything more than 10 pounds or play contact sports for 3 weeks, or as directed. SEEK MEDICAL CARE IF:  There is redness, swelling, or increasing pain in the wound.  There is fluid (pus) coming from the wound.  There is drainage from a wound lasting longer than 1 day.  You have an oral temperature above 102 F (38.9 C).  You notice a bad smell coming from the wound or dressing.  The wound breaks open after the stitches (sutures) have been removed.  You notice  increasing pain in the shoulders (shoulder strap areas).  You develop dizzy episodes or fainting while standing.  You feel sick to your stomach (nauseous) or throw up (vomit). SEEK IMMEDIATE MEDICAL CARE IF:  You develop a rash.  You have difficulty breathing.  You develop a reaction or have side effects to medicines you were given. MAKE SURE YOU:   Understand these instructions.  Will watch your condition.  Will get help right away if you are not doing well or get worse. Document Released: 09/15/2006 Document Revised: 11/07/2011 Document Reviewed: 07/15/2009 [facility] Patient Information 2014 [place], [place]. Inguinal Hernia, Adult Muscles help keep everything in the body in its proper place. But if a weak spot in the muscles develops, something can poke through. That is called a hernia. When this happens in the lower part of the belly (abdomen), it is called an inguinal hernia. (It takes its name from a part of the body in this region called the inguinal canal.) A weak spot in the wall of muscles lets some fat or part of the small intestine bulge through. An inguinal hernia can develop at any age. Men get them more often than women. CAUSES  In adults, an inguinal hernia develops over time.  It can be triggered by:  Suddenly straining the muscles of the lower abdomen.  Lifting heavy objects.  Straining to have a bowel movement. Difficult bowel movements (constipation) can lead to this.  Constant coughing. This  may be caused by smoking or lung disease.  Being overweight.  Being pregnant.  Working at a job that requires long periods of standing or heavy lifting.  Having had an inguinal hernia before. One type can be an emergency situation. It is called a strangulated inguinal hernia. It develops if part of the small intestine slips through the weak spot and cannot get back into the abdomen. The blood supply can be cut off. If that happens, part of the intestine may die. This  situation requires emergency surgery. SYMPTOMS  Often, a small inguinal hernia has no symptoms. It is found when a healthcare provider does a physical exam. Larger hernias usually have symptoms.   In adults, symptoms may include:  A lump in the groin. This is easier to see when the person is standing. It might disappear when lying down.  In men, a lump in the scrotum.  Pain or burning in the groin. This occurs especially when lifting, straining or coughing.  A dull ache or feeling of pressure in the groin.  Signs of a strangulated hernia can include:  A bulge in the groin that becomes very painful and tender to the touch.  A bulge that turns red or purple.  Fever, nausea and vomiting.  Inability to have a bowel movement or to pass gas. DIAGNOSIS  To decide if you have an inguinal hernia, a healthcare provider will probably do a physical examination.  This will include asking questions about any symptoms you have noticed.  The healthcare provider might feel the groin area and ask you to cough. If an inguinal hernia is felt, the healthcare provider may try to slide it back into the abdomen.  Usually no other tests are needed. TREATMENT  Treatments can vary. The size of the hernia makes a difference. Options include:  Watchful waiting. This is often suggested if the hernia is small and you have had no symptoms.  No medical procedure will be done unless symptoms develop.  You will need to watch closely for symptoms. If any occur, contact your healthcare provider right away.  Surgery. This is used if the hernia is larger or you have symptoms.  Open surgery. This is usually an outpatient procedure (you will not stay overnight in a hospital). An cut (incision) is made through the skin in the groin. The hernia is put back inside the abdomen. The weak area in the muscles is then repaired by herniorrhaphy or hernioplasty. Herniorrhaphy: in this type of surgery, the weak muscles are  sewn back together. Hernioplasty: a patch or mesh is used to close the weak area in the abdominal wall.  Laparoscopy. In this procedure, a surgeon makes small incisions. A thin tube with a tiny video camera (called a laparoscope) is put into the abdomen. The surgeon repairs the hernia with mesh by looking with the video camera and using two long instruments. HOME CARE INSTRUCTIONS   After surgery to repair an inguinal hernia:  You will need to take pain medicine prescribed by your healthcare provider. Follow all directions carefully.  You will need to take care of the wound from the incision.  Your activity will be restricted for awhile. This will probably include no heavy lifting for several weeks. You also should not do anything too active for a few weeks. When you can return to work will depend on the type of job that you have.  During "watchful waiting" periods, you should:  Maintain a healthy weight.  Eat a diet high  in fiber (fruits, vegetables and whole grains).  Drink plenty of fluids to avoid constipation. This means drinking enough water and other liquids to keep your urine clear or pale yellow.  Do not lift heavy objects.  Do not stand for long periods of time.  Quit smoking. This should keep you from developing a frequent cough. SEEK MEDICAL CARE IF:   A bulge develops in your groin area.  You feel pain, a burning sensation or pressure in the groin. This might be worse if you are lifting or straining.  You develop a fever of more than 100.5 F (38.1 C). SEEK IMMEDIATE MEDICAL CARE IF:   Pain in the groin increases suddenly.  A bulge in the groin gets bigger suddenly and does not go down.  For men, there is sudden pain in the scrotum. Or, the size of the scrotum increases.  A bulge in the groin area becomes red or purple and is painful to touch.  You have nausea or vomiting that does not go away.  You feel your heart beating much faster than normal.  You  cannot have a bowel movement or pass gas.  You develop a fever of more than 102.0 F (38.9 C). Document Released: 01/01/2009 Document Revised: 11/07/2011 Document Reviewed: 01/01/2009 [facility] Patient Information 2014 [place], [place].

## 2013-09-09 ENCOUNTER — Encounter (HOSPITAL_COMMUNITY): Payer: Self-pay | Admitting: Pharmacy Technician

## 2013-09-10 ENCOUNTER — Telehealth (INDEPENDENT_AMBULATORY_CARE_PROVIDER_SITE_OTHER): Payer: Self-pay

## 2013-09-10 NOTE — Patient Instructions (Addendum)
Carl Orozco  09/10/2013                           YOUR PROCEDURE IS SCHEDULED ON:  09/17/13               PLEASE REPORT TO SHORT STAY CENTER AT : 5:30 am               CALL THIS NUMBER IF ANY PROBLEMS THE DAY OF SURGERY :               [phone]                      REMEMBER:   Do not eat food or drink liquids AFTER MIDNIGHT   Take these medicines the morning of surgery with A SIP OF WATER: ZETIA / MAY USE FLONASE IF NEEDED / BRING INHALER TO HOSPITAL   Do not wear jewelry, make-up   Do not wear lotions, powders, or perfumes.   Do not shave legs or underarms 12 hrs. before surgery (men may shave face)  Do not bring valuables to the hospital.  Contacts, dentures or bridgework may not be worn into surgery.  Leave suitcase in the car. After surgery it may be brought to your room.  For patients admitted to the hospital more than one night, checkout time is 11:00                          The day of discharge.   Patients discharged the day of surgery will not be allowed to drive home                             If going home same day of surgery, must have someone stay with you first                           24 hrs at home and arrange for some one to drive you home from hospital.    Special Instructions:   Please read over the following fact sheets that you were given:              1. [place] PREPARING FOR SURGERY SHEET               2. Stop all aspirin and herbal meds 5 days preop                                                X_____________________________________________________________________        Failure to follow these instructions may result in cancellation of your surgery

## 2013-09-10 NOTE — Telephone Encounter (Signed)
Pt also has made arrangements with his PCP, Devra Doppamieka Howell to have his referral processed since he has [employer] insurance.

## 2013-09-10 NOTE — Telephone Encounter (Signed)
Pt is scheduled for open RIH repair with mesh on 09/17/13 by Dr. Derrell LollingIngram.  He has no one at home to help him after the surgery.  All of his family lives out of state and any friends are elderly.  He is not sure what arrangements he can make if any for after surgery.  I told him that Dr. Derrell LollingIngram may order home health for him or he may let him stay in the hospital an extra day.  I emphasized that there would be extra costs involved with either of these scenarios.  The patient understood and is willing to do whatever Dr. Derrell LollingIngram suggests.

## 2013-09-10 NOTE — Telephone Encounter (Signed)
Change his admission status to outpatient with extended recovery. It is okay to keep him one night since there is no family or friends to stay with him that night.  hmi

## 2013-09-10 NOTE — Telephone Encounter (Signed)
I called the pt back and let him know Dr Derrell LollingIngram said we can change him to over night stay (outpatient with extended recovery).  He said he is still working on getting the referral from his PCP for his 09/02/2013 appointment.  He should hear from them today or tomorrow.

## 2013-09-11 ENCOUNTER — Encounter (HOSPITAL_COMMUNITY)
Admission: RE | Admit: 2013-09-11 | Discharge: 2013-09-11 | Disposition: A | Discharge status: Home / Self Care | Payer: Medicare FFS | Source: Ambulatory Visit | Attending: General Surgery | Admitting: General Surgery

## 2013-09-11 ENCOUNTER — Encounter (HOSPITAL_COMMUNITY): Payer: Self-pay

## 2013-09-11 DIAGNOSIS — Z01812 Encounter for preprocedural laboratory examination: Secondary | ICD-10-CM | POA: Insufficient documentation

## 2013-09-11 DIAGNOSIS — Z01818 Encounter for other preprocedural examination: Secondary | ICD-10-CM | POA: Insufficient documentation

## 2013-09-11 HISTORY — DX: Dermatitis, unspecified: L30.9

## 2013-09-11 HISTORY — DX: Personal history of other venous thrombosis and embolism: Z86.718

## 2013-09-11 HISTORY — DX: Unspecified asthma, uncomplicated: J45.909

## 2013-09-11 LAB — COMPREHENSIVE METABOLIC PANEL
ALBUMIN: 3.8 g/dL (ref 3.5–5.2)
ALT: 20 U/L (ref 0–53)
AST: 19 U/L (ref 0–37)
Alkaline Phosphatase: 56 U/L (ref 39–117)
BILIRUBIN TOTAL: 0.3 mg/dL (ref 0.3–1.2)
BUN: 10 mg/dL (ref 6–23)
CALCIUM: 9 mg/dL (ref 8.4–10.5)
CO2: 27 mEq/L (ref 19–32)
CREATININE: 0.99 mg/dL (ref 0.50–1.35)
Chloride: 98 mEq/L (ref 96–112)
GFR calc Af Amer: >90 mL/min (ref >90)
Glucose, Bld: 99 mg/dL (ref 70–99)
Potassium: 4.1 mEq/L (ref 3.7–5.3)
Sodium: 136 mEq/L — ABNORMAL LOW (ref 137–147)
Total Protein: 7.6 g/dL (ref 6.0–8.3)

## 2013-09-11 LAB — CBC WITH DIFFERENTIAL/PLATELET
BASOS ABS: 0.1 10*3/uL (ref 0.0–0.1)
BASOS PCT: 1 % (ref 0–1)
EOS PCT: 2 % (ref 0–5)
Eosinophils Absolute: 0.2 10*3/uL (ref 0.0–0.7)
HEMATOCRIT: 43 % (ref 39.0–52.0)
HEMOGLOBIN: 15 g/dL (ref 13.0–17.0)
Lymphocytes Relative: 38 % (ref 12–46)
Lymphs Abs: 3.7 10*3/uL (ref 0.7–4.0)
MCH: 30.1 pg (ref 26.0–34.0)
MCHC: 34.9 g/dL (ref 30.0–36.0)
MCV: 86.2 fL (ref 78.0–100.0)
MONO ABS: 0.9 10*3/uL (ref 0.1–1.0)
MONOS PCT: 9 % (ref 3–12)
Neutro Abs: 4.7 10*3/uL (ref 1.7–7.7)
Neutrophils Relative %: 50 % (ref 43–77)
Platelets: 257 10*3/uL (ref 150–400)
RBC: 4.99 MIL/uL (ref 4.22–5.81)
RDW: 13.3 % (ref 11.5–15.5)
WBC: 9.5 10*3/uL (ref 4.0–10.5)

## 2013-09-11 NOTE — Progress Notes (Signed)
09/11/13 0934  OBSTRUCTIVE SLEEP APNEA  Have you ever been diagnosed with sleep apnea through a sleep study? No  Do you snore loudly (loud enough to be heard through closed doors)?  0  Do you often feel tired, fatigued, or sleepy during the daytime? 0  Has anyone observed you stop breathing during your sleep? 0  Do you have, or are you being treated for high blood pressure? 1  BMI more than 35 kg/m2? 1  Age over 53 years old? 1  Neck circumference greater than 40 cm/18 inches? 1  Gender: 1  Obstructive Sleep Apnea Score 5  Score 4 or greater  Results sent to PCP

## 2013-09-16 NOTE — H&P (Signed)
Carl Orozco    MRN:  [id number]   Description: 53 year old male  Provider: Ernestene Mention, MD  Department: [facility]          Diagnoses      Right inguinal hernia    -  Primary      550.90               Current Vitals - Last Recorded      BP Pulse Temp(Src) Resp Ht Wt      138/82 72 98.5 F (36.9 C) (Oral) 18 5' 10.5" (1.791 m) 252 lb (114.306 kg)       BMI - 35.64 kg/m2                      History and Phjysical   Ernestene Mention, MD     Status: Signed            Patient ID: Carl Orozco, male   DOB: [birth year], 53 y.o.   MRN: [id number]                 HPI [name] is a 53 y.o. male.  He is referred by Dr. Azalia Bilis in the emergency department for evaluation and management of a symptomatic right inguinal hernia. He states that Helyn App, MD at [facility] is his PCP and manages his back pain and prescription medication. He has been seen at [facility] in the past by Junius Argyle, FNP as well.    He has noticed a painful bulge in his right groin for about 6 months. This is getting worse. No obvious incarceration. No prior history of hernia repair her hernia surgery.   Comorbidities include disability, permanent, for back pain. He's had lumbar disc surgery by Dr. Alveda Reasons through a left retroperitoneal approach. He is seen in the emergency room frequently for back pain. He has allergic rhinitis and uses albuterol inhalers. He has hypertension and hyperlipidemia.   He lives alone. He is able to drive a car. He smokes a half a pack of cigarettes per day. Denies alcohol use.       Past Medical History   Diagnosis  Date   .  Hypertension     .  High cholesterol     .  Arthritis     .  Neuromuscular disorder           Past Surgical History   Procedure  Laterality  Date   .  Back surgery             Family History   Problem  Relation  Age of Onset   .  Hypertension  Mother          Social  History History   Substance Use Topics   .  Smoking status:  Current Some Day Smoker -- 0.50 packs/day       Types:  Cigarettes   .  Smokeless tobacco:  Never Used   .  Alcohol Use:  No         Allergies   Allergen  Reactions   .  Aspirin  Hives   .  Fish Allergy  Hives   .  Nsaids  Hives         Current Outpatient Prescriptions   Medication  Sig  Dispense  Refill   .  albuterol (PROVENTIL HFA;VENTOLIN HFA) 108 (90 BASE) MCG/ACT inhaler  Inhale 2 puffs into the lungs every  6 (six) hours as needed for wheezing or shortness of breath. For shortness of breath   1 Inhaler   5   .  ezetimibe (ZETIA) 10 MG tablet  Take 10 mg by mouth daily.         .  fluticasone (FLONASE) 50 MCG/ACT nasal spray  Place 2 sprays into the nose daily as needed for rhinitis or allergies.   16 g   5   .  HYDROcodone-acetaminophen (NORCO/VICODIN) 5-325 MG per tablet  Take 1 tablet by mouth 2 (two) times daily as needed for moderate pain or severe pain. Filled 9/11 for 10 tablets.         .  simvastatin (ZOCOR) 10 MG tablet  Take 1 tablet (10 mg total) by mouth at bedtime.   30 tablet   5   .  valsartan-hydrochlorothiazide (DIOVAN-HCT) 320-25 MG per tablet  Take 1 tablet by mouth daily.         Marland Kitchen  oxyCODONE-acetaminophen (PERCOCET/ROXICET) 5-325 MG per tablet  Take 1-2 tablets by mouth every 4 (four) hours as needed for severe pain.   20 tablet   0       No current facility-administered medications for this visit.        Review of Systems   Constitutional: Negative for fever, chills and unexpected weight change.  HENT: Positive for rhinorrhea and sinus pressure. Negative for congestion, hearing loss, sore throat, trouble swallowing and voice change.   Eyes: Negative for visual disturbance.  Respiratory: Positive for cough. Negative for wheezing.   Cardiovascular: Negative for chest pain, palpitations and leg swelling.  Gastrointestinal: Negative for nausea, vomiting, abdominal pain, diarrhea,  constipation, blood in stool, abdominal distention, anal bleeding and rectal pain.  Genitourinary: Negative for hematuria and difficulty urinating.  Musculoskeletal: Positive for back pain and gait problem. Negative for arthralgias.        Walks with a cane. Has been followed by a pain clinic in the past, but now states his PCP takes care of his chronic pain management.  Skin: Negative for rash and wound.  Neurological: Negative for seizures, syncope, weakness and headaches.  Hematological: Negative for adenopathy. Does not bruise/bleed easily.  Psychiatric/Behavioral: Negative for confusion.      Blood pressure 138/82, pulse 72, temperature 98.5 F (36.9 C), temperature source Oral, resp. rate 18, height 5' 10.5" (1.791 m), weight 252 lb (114.306 kg).   Physical Exam  Constitutional: He is oriented to person, place, and time. He appears well-developed and well-nourished. No distress.  HENT:   Head: Normocephalic.   Nose: Nose normal.   Mouth/Throat: No oropharyngeal exudate.  Eyes: Conjunctivae and EOM are normal. Pupils are equal, round, and reactive to light. Right eye exhibits no discharge. Left eye exhibits no discharge. No scleral icterus.  Extraocular movements are full on direct confrontation. At rest he has a disconjugate gaze with lateral deviation of the left.  Neck: Normal range of motion. Neck supple. No JVD present. No tracheal deviation present. No thyromegaly present.  Cardiovascular: Normal rate, regular rhythm, normal heart sounds and intact distal pulses.    No murmur heard. Pulmonary/Chest: Effort normal and breath sounds normal. No stridor. No respiratory distress. He has no wheezes. He has no rales. He exhibits no tenderness.  Abdominal: Soft. Bowel sounds are normal. He exhibits no distension and no mass. There is no tenderness. There is no rebound and no guarding.    Overweight with small panniculus. Small nonreducible umbilical hernia, nontender.   Genitourinary:  Moderate size right inguinal hernia, tender, basically reducible when supine. Does not extend past the external ring. No evidence of hernia in the left inguinal or femoral position. Penis scrotum and testes are normal. No scrotal mass.  Musculoskeletal: Normal range of motion. He exhibits no edema and no tenderness.  Lymphadenopathy:    He has no cervical adenopathy.  Neurological: He is alert and oriented to person, place, and time. He has normal reflexes. Coordination normal.  Skin: Skin is warm and dry. No rash noted. He is not diaphoretic. No erythema. No pallor.  Psychiatric: He has a normal mood and affect. His behavior is normal. Judgment and thought content normal.      Data Reviewed Emergency department records. [facility] records.   Assessment    Right inguinal hernia, very symptomatic, reducible   Umbilical hernia, small, asymptomatic   History left retroperitoneal lumbar disc surgery.   Chronic back pain resulting in permanent disability And chronic pain   Hypertension   Hyperlipidemia   Allergic rhinitis      Plan    He would like to proceed with repair of his right inguinal hernia immediately. I think that is appropriate, given his symptoms.   Scheduled for open repair right inguinal hernia with mesh.   I discussed the indications, details, techniques, and numerous risk of the surgery with him. He is aware of the risk of bleeding, infection, recurrence of the hernia, nerve damage with chronic pain or numbness, damage to the testicle or intestine or bladder with major reconstructive surgery. At this time all his questions were answered and he understands all these issues. He agrees with this plan.        Angelia MouldHaywood M. Derrell LollingIngram, M.D., [facility] General and Minimally invasive Surgery Breast and Colorectal Surgery Office:   [phone] Pager:   [phone]

## 2013-09-17 ENCOUNTER — Encounter (HOSPITAL_COMMUNITY): Payer: Self-pay | Admitting: *Deleted

## 2013-09-17 ENCOUNTER — Encounter (HOSPITAL_COMMUNITY)
Admission: RE | Disposition: A | Discharge status: Home / Self Care | Payer: Self-pay | Source: Ambulatory Visit | Attending: General Surgery

## 2013-09-17 ENCOUNTER — Observation Stay (HOSPITAL_COMMUNITY)
Admission: RE | Admit: 2013-09-17 | Discharge: 2013-09-18 | Disposition: A | Discharge status: Home / Self Care | Payer: Medicare FFS | Source: Ambulatory Visit | Attending: General Surgery | Admitting: General Surgery

## 2013-09-17 ENCOUNTER — Encounter (HOSPITAL_COMMUNITY): Payer: Medicare FFS | Admitting: Anesthesiology

## 2013-09-17 ENCOUNTER — Ambulatory Visit (HOSPITAL_COMMUNITY): Payer: Medicare FFS | Admitting: Anesthesiology

## 2013-09-17 DIAGNOSIS — Z79899 Other long term (current) drug therapy: Secondary | ICD-10-CM | POA: Insufficient documentation

## 2013-09-17 DIAGNOSIS — K429 Umbilical hernia without obstruction or gangrene: Secondary | ICD-10-CM | POA: Insufficient documentation

## 2013-09-17 DIAGNOSIS — F17200 Nicotine dependence, unspecified, uncomplicated: Secondary | ICD-10-CM | POA: Insufficient documentation

## 2013-09-17 DIAGNOSIS — J309 Allergic rhinitis, unspecified: Secondary | ICD-10-CM | POA: Insufficient documentation

## 2013-09-17 DIAGNOSIS — E7800 Pure hypercholesterolemia, unspecified: Secondary | ICD-10-CM | POA: Insufficient documentation

## 2013-09-17 DIAGNOSIS — G8929 Other chronic pain: Secondary | ICD-10-CM | POA: Insufficient documentation

## 2013-09-17 DIAGNOSIS — E785 Hyperlipidemia, unspecified: Secondary | ICD-10-CM | POA: Insufficient documentation

## 2013-09-17 DIAGNOSIS — M549 Dorsalgia, unspecified: Secondary | ICD-10-CM | POA: Insufficient documentation

## 2013-09-17 DIAGNOSIS — K4090 Unilateral inguinal hernia, without obstruction or gangrene, not specified as recurrent: Principal | ICD-10-CM | POA: Diagnosis present

## 2013-09-17 DIAGNOSIS — I10 Essential (primary) hypertension: Secondary | ICD-10-CM | POA: Insufficient documentation

## 2013-09-17 HISTORY — PX: INGUINAL HERNIA REPAIR: SHX194

## 2013-09-17 HISTORY — PX: INSERTION OF MESH: SHX5868

## 2013-09-17 SURGERY — REPAIR, HERNIA, INGUINAL, ADULT
Anesthesia: General | Site: Groin | Laterality: Right

## 2013-09-17 MED ORDER — ACETAMINOPHEN 10 MG/ML IV SOLN
1000.0000 mg | Freq: Once | INTRAVENOUS | Status: AC
  Administered 2013-09-17: 1000 mg via INTRAVENOUS
  Filled 2013-09-17: qty 100

## 2013-09-17 MED ORDER — CHLORHEXIDINE GLUCONATE 4 % EX LIQD: 1.0000 "application " | Freq: Once | CUTANEOUS | Status: DC

## 2013-09-17 MED ORDER — OXYCODONE HCL 5 MG PO TABS: 5.0000 mg | ORAL_TABLET | Freq: Once | ORAL | Status: DC | PRN

## 2013-09-17 MED ORDER — HYDROCHLOROTHIAZIDE 25 MG PO TABS
25.0000 mg | ORAL_TABLET | Freq: Every day | ORAL | Status: DC
  Filled 2013-09-17: qty 1

## 2013-09-17 MED ORDER — IRBESARTAN 300 MG PO TABS
300.0000 mg | ORAL_TABLET | Freq: Every day | ORAL | Status: DC
  Filled 2013-09-17: qty 1

## 2013-09-17 MED ORDER — GLYCOPYRROLATE 0.2 MG/ML IJ SOLN
INTRAMUSCULAR | Status: AC
  Filled 2013-09-17: qty 2

## 2013-09-17 MED ORDER — SIMVASTATIN 10 MG PO TABS
10.0000 mg | ORAL_TABLET | Freq: Every day | ORAL | Status: DC
  Administered 2013-09-17: 10 mg via ORAL
  Filled 2013-09-17 (×2): qty 1

## 2013-09-17 MED ORDER — MIDAZOLAM HCL 2 MG/2ML IJ SOLN
INTRAMUSCULAR | Status: AC
  Filled 2013-09-17: qty 2

## 2013-09-17 MED ORDER — PROMETHAZINE HCL 25 MG/ML IJ SOLN: 6.2500 mg | INTRAMUSCULAR | Status: DC | PRN

## 2013-09-17 MED ORDER — ALBUTEROL SULFATE (2.5 MG/3ML) 0.083% IN NEBU: 2.5000 mg | INHALATION_SOLUTION | Freq: Four times a day (QID) | RESPIRATORY_TRACT | Status: DC | PRN

## 2013-09-17 MED ORDER — PHENYLEPHRINE 40 MCG/ML (10ML) SYRINGE FOR IV PUSH (FOR BLOOD PRESSURE SUPPORT)
PREFILLED_SYRINGE | INTRAVENOUS | Status: AC
  Filled 2013-09-17: qty 10

## 2013-09-17 MED ORDER — ENOXAPARIN SODIUM 40 MG/0.4ML ~~LOC~~ SOLN
40.0000 mg | SUBCUTANEOUS | Status: DC
  Administered 2013-09-18: 40 mg via SUBCUTANEOUS
  Filled 2013-09-17 (×2): qty 0.4

## 2013-09-17 MED ORDER — KETAMINE HCL 10 MG/ML IJ SOLN
INTRAMUSCULAR | Status: DC | PRN
  Administered 2013-09-17: 30 mg via INTRAVENOUS

## 2013-09-17 MED ORDER — OXYCODONE HCL 5 MG/5ML PO SOLN
5.0000 mg | Freq: Once | ORAL | Status: DC | PRN
  Filled 2013-09-17: qty 5

## 2013-09-17 MED ORDER — CEFAZOLIN SODIUM-DEXTROSE 2-3 GM-% IV SOLR
INTRAVENOUS | Status: AC
  Filled 2013-09-17: qty 50

## 2013-09-17 MED ORDER — KETAMINE HCL 10 MG/ML IJ SOLN
INTRAMUSCULAR | Status: AC
  Filled 2013-09-17: qty 1

## 2013-09-17 MED ORDER — GLYCOPYRROLATE 0.2 MG/ML IJ SOLN
INTRAMUSCULAR | Status: DC | PRN
  Administered 2013-09-17: 0.4 mg via INTRAVENOUS

## 2013-09-17 MED ORDER — NEOSTIGMINE METHYLSULFATE 1 MG/ML IJ SOLN
INTRAMUSCULAR | Status: DC | PRN
  Administered 2013-09-17: 3 mg via INTRAVENOUS

## 2013-09-17 MED ORDER — MEPERIDINE HCL 50 MG/ML IJ SOLN
6.2500 mg | INTRAMUSCULAR | Status: DC | PRN
Start: 2013-09-17 — End: 2013-09-17

## 2013-09-17 MED ORDER — SODIUM CHLORIDE 0.9 % IJ SOLN
INTRAMUSCULAR | Status: AC
  Filled 2013-09-17: qty 10

## 2013-09-17 MED ORDER — 0.9 % SODIUM CHLORIDE (POUR BTL) OPTIME
TOPICAL | Status: DC | PRN
  Administered 2013-09-17: 1000 mL

## 2013-09-17 MED ORDER — POTASSIUM CHLORIDE IN NACL 20-0.9 MEQ/L-% IV SOLN
INTRAVENOUS | Status: DC
  Administered 2013-09-17: 13:00:00 via INTRAVENOUS
  Filled 2013-09-17 (×2): qty 1000

## 2013-09-17 MED ORDER — PROPOFOL 10 MG/ML IV BOLUS
INTRAVENOUS | Status: DC | PRN
  Administered 2013-09-17: 200 mg via INTRAVENOUS

## 2013-09-17 MED ORDER — FENTANYL CITRATE 0.05 MG/ML IJ SOLN
INTRAMUSCULAR | Status: AC
  Filled 2013-09-17: qty 5

## 2013-09-17 MED ORDER — EZETIMIBE 10 MG PO TABS
10.0000 mg | ORAL_TABLET | Freq: Every day | ORAL | Status: DC
  Administered 2013-09-18: 10 mg via ORAL
  Filled 2013-09-17 (×2): qty 1

## 2013-09-17 MED ORDER — POLYETHYLENE GLYCOL 3350 17 G PO PACK
17.0000 g | PACK | Freq: Every day | ORAL | Status: DC
  Administered 2013-09-17: 17 g via ORAL
  Filled 2013-09-17 (×2): qty 1

## 2013-09-17 MED ORDER — OXYCODONE-ACETAMINOPHEN 5-325 MG PO TABS
1.0000 | ORAL_TABLET | ORAL | Status: DC | PRN
  Administered 2013-09-17 – 2013-09-18 (×4): 2 via ORAL
  Filled 2013-09-17 (×4): qty 2

## 2013-09-17 MED ORDER — LACTATED RINGERS IV SOLN
INTRAVENOUS | Status: DC | PRN
  Administered 2013-09-17 (×2): via INTRAVENOUS

## 2013-09-17 MED ORDER — EPHEDRINE SULFATE 50 MG/ML IJ SOLN
INTRAMUSCULAR | Status: AC
  Filled 2013-09-17: qty 2

## 2013-09-17 MED ORDER — ONDANSETRON HCL 4 MG PO TABS: 4.0000 mg | ORAL_TABLET | Freq: Four times a day (QID) | ORAL | Status: DC | PRN

## 2013-09-17 MED ORDER — MIDAZOLAM HCL 5 MG/5ML IJ SOLN
INTRAMUSCULAR | Status: DC | PRN
  Administered 2013-09-17: 1 mg via INTRAVENOUS

## 2013-09-17 MED ORDER — CISATRACURIUM BESYLATE 20 MG/10ML IV SOLN
INTRAVENOUS | Status: AC
  Filled 2013-09-17: qty 10

## 2013-09-17 MED ORDER — HYDROMORPHONE HCL PF 1 MG/ML IJ SOLN
INTRAMUSCULAR | Status: AC
Start: 2013-09-17 — End: 2013-09-17
  Filled 2013-09-17: qty 1

## 2013-09-17 MED ORDER — EPHEDRINE SULFATE 50 MG/ML IJ SOLN
INTRAMUSCULAR | Status: DC | PRN
  Administered 2013-09-17: 10 mg via INTRAVENOUS

## 2013-09-17 MED ORDER — ONDANSETRON HCL 4 MG/2ML IJ SOLN
INTRAMUSCULAR | Status: DC | PRN
  Administered 2013-09-17 (×2): 2 mg via INTRAVENOUS

## 2013-09-17 MED ORDER — BUPIVACAINE-EPINEPHRINE 0.5% -1:200000 IJ SOLN
INTRAMUSCULAR | Status: DC | PRN
  Administered 2013-09-17: 18 mL

## 2013-09-17 MED ORDER — PHENYLEPHRINE HCL 10 MG/ML IJ SOLN
INTRAMUSCULAR | Status: DC | PRN
  Administered 2013-09-17 (×2): 40 ug via INTRAVENOUS

## 2013-09-17 MED ORDER — FENTANYL CITRATE 0.05 MG/ML IJ SOLN
INTRAMUSCULAR | Status: DC | PRN
Start: 2013-09-17 — End: 2013-09-17
  Administered 2013-09-17: 100 ug via INTRAVENOUS
  Administered 2013-09-17: 25 ug via INTRAVENOUS
  Administered 2013-09-17: 50 ug via INTRAVENOUS

## 2013-09-17 MED ORDER — FENTANYL CITRATE 0.05 MG/ML IJ SOLN
25.0000 ug | INTRAMUSCULAR | Status: DC | PRN
  Administered 2013-09-17 – 2013-09-18 (×8): 50 ug via INTRAVENOUS
  Filled 2013-09-17 (×8): qty 2

## 2013-09-17 MED ORDER — FLUTICASONE PROPIONATE 50 MCG/ACT NA SUSP
2.0000 | Freq: Every day | NASAL | Status: DC
  Administered 2013-09-18: 2 via NASAL
  Filled 2013-09-17: qty 16

## 2013-09-17 MED ORDER — BUPIVACAINE-EPINEPHRINE PF 0.5-1:200000 % IJ SOLN
INTRAMUSCULAR | Status: AC
  Filled 2013-09-17: qty 30

## 2013-09-17 MED ORDER — HYDROMORPHONE HCL PF 1 MG/ML IJ SOLN
INTRAMUSCULAR | Status: AC
  Filled 2013-09-17: qty 1

## 2013-09-17 MED ORDER — VALSARTAN-HYDROCHLOROTHIAZIDE 320-25 MG PO TABS: 1.0000 | ORAL_TABLET | Freq: Every day | ORAL | Status: DC

## 2013-09-17 MED ORDER — SUCCINYLCHOLINE CHLORIDE 20 MG/ML IJ SOLN
INTRAMUSCULAR | Status: DC | PRN
  Administered 2013-09-17: 150 mg via INTRAVENOUS

## 2013-09-17 MED ORDER — CEFAZOLIN SODIUM-DEXTROSE 2-3 GM-% IV SOLR
2.0000 g | INTRAVENOUS | Status: AC
  Administered 2013-09-17: 2 g via INTRAVENOUS

## 2013-09-17 MED ORDER — ONDANSETRON HCL 4 MG/2ML IJ SOLN: 4.0000 mg | Freq: Four times a day (QID) | INTRAMUSCULAR | Status: DC | PRN

## 2013-09-17 MED ORDER — CISATRACURIUM BESYLATE (PF) 10 MG/5ML IV SOLN
INTRAVENOUS | Status: DC | PRN
  Administered 2013-09-17: 10 mg via INTRAVENOUS

## 2013-09-17 MED ORDER — PROPOFOL 10 MG/ML IV BOLUS
INTRAVENOUS | Status: AC
  Filled 2013-09-17: qty 20

## 2013-09-17 MED ORDER — ALBUTEROL SULFATE HFA 108 (90 BASE) MCG/ACT IN AERS: 2.0000 | INHALATION_SPRAY | Freq: Four times a day (QID) | RESPIRATORY_TRACT | Status: DC | PRN

## 2013-09-17 MED ORDER — LIDOCAINE HCL (CARDIAC) 20 MG/ML IV SOLN
INTRAVENOUS | Status: DC | PRN
  Administered 2013-09-17: 30 mg via INTRAVENOUS
  Administered 2013-09-17: 20 mg via INTRAVENOUS

## 2013-09-17 MED ORDER — HYDROMORPHONE HCL PF 1 MG/ML IJ SOLN
0.2500 mg | INTRAMUSCULAR | Status: DC | PRN
  Administered 2013-09-17 (×2): 0.25 mg via INTRAVENOUS
  Administered 2013-09-17 (×3): 0.5 mg via INTRAVENOUS

## 2013-09-17 SURGICAL SUPPLY — 44 items
BENZOIN TINCTURE PRP APPL 2/3 (GAUZE/BANDAGES/DRESSINGS) IMPLANT
BLADE HEX COATED 2.75 (ELECTRODE) ×2 IMPLANT
BLADE SURG 15 STRL LF DISP TIS (BLADE) ×1 IMPLANT
BLADE SURG 15 STRL SS (BLADE) ×1
CANISTER SUCTION 2500CC (MISCELLANEOUS) ×2 IMPLANT
CHLORAPREP W/TINT 26ML (MISCELLANEOUS) ×2 IMPLANT
DECANTER SPIKE VIAL GLASS SM (MISCELLANEOUS) ×2 IMPLANT
DERMABOND ADVANCED (GAUZE/BANDAGES/DRESSINGS) ×1
DERMABOND ADVANCED .7 DNX12 (GAUZE/BANDAGES/DRESSINGS) ×1 IMPLANT
DISSECTOR ROUND CHERRY 3/8 STR (MISCELLANEOUS) IMPLANT
DRAIN PENROSE 18X1/2 LTX STRL (DRAIN) IMPLANT
DRAPE LAPAROTOMY TRNSV 102X78 (DRAPE) ×2 IMPLANT
ELECT REM PT RETURN 9FT ADLT (ELECTROSURGICAL) ×2
ELECTRODE REM PT RTRN 9FT ADLT (ELECTROSURGICAL) ×1 IMPLANT
GLOVE EUDERMIC 7 POWDERFREE (GLOVE) ×2 IMPLANT
GOWN STRL REUS W/TWL XL LVL3 (GOWN DISPOSABLE) ×4 IMPLANT
KIT BASIN OR (CUSTOM PROCEDURE TRAY) ×2 IMPLANT
MESH ULTRAPRO 3X6 7.6X15CM (Mesh General) ×2 IMPLANT
NEEDLE HYPO 25X1 1.5 SAFETY (NEEDLE) ×2 IMPLANT
PACK BASIC VI WITH GOWN DISP (CUSTOM PROCEDURE TRAY) ×2 IMPLANT
PENCIL BUTTON HOLSTER BLD 10FT (ELECTRODE) ×2 IMPLANT
SPONGE GAUZE 4X4 12PLY (GAUZE/BANDAGES/DRESSINGS) IMPLANT
SPONGE LAP 4X18 X RAY DECT (DISPOSABLE) ×2 IMPLANT
STAPLER VISISTAT 35W (STAPLE) IMPLANT
STRIP CLOSURE SKIN 1/2X4 (GAUZE/BANDAGES/DRESSINGS) IMPLANT
SUT MNCRL AB 4-0 PS2 18 (SUTURE) ×2 IMPLANT
SUT PROLENE 2 0 CT2 30 (SUTURE) ×6 IMPLANT
SUT SILK 2 0 (SUTURE)
SUT SILK 2 0 SH (SUTURE) IMPLANT
SUT SILK 2-0 18XBRD TIE 12 (SUTURE) IMPLANT
SUT VIC AB 2-0 CT1 27 (SUTURE) ×1
SUT VIC AB 2-0 CT1 TAPERPNT 27 (SUTURE) ×1 IMPLANT
SUT VIC AB 2-0 SH 27 (SUTURE) ×1
SUT VIC AB 2-0 SH 27X BRD (SUTURE) ×1 IMPLANT
SUT VIC AB 3-0 SH 27 (SUTURE) ×1
SUT VIC AB 3-0 SH 27XBRD (SUTURE) ×1 IMPLANT
SUT VICRYL 2 0 18  UND BR (SUTURE)
SUT VICRYL 2 0 18 UND BR (SUTURE) IMPLANT
SYR 20CC LL (SYRINGE) ×2 IMPLANT
SYR BULB IRRIGATION 50ML (SYRINGE) ×2 IMPLANT
TOWEL OR 17X26 10 PK STRL BLUE (TOWEL DISPOSABLE) ×2 IMPLANT
TOWEL OR NON WOVEN STRL DISP B (DISPOSABLE) ×2 IMPLANT
TRAY FOLEY CATH 16FRSI W/METER (SET/KITS/TRAYS/PACK) IMPLANT
YANKAUER SUCT BULB TIP 10FT TU (MISCELLANEOUS) IMPLANT

## 2013-09-17 NOTE — Op Note (Signed)
Patient Name:           Adele BarthelMitchell Popovich   Date of Surgery:        09/17/2013  Pre op Diagnosis:      Right inguinal hernia  Post op Diagnosis:    Right inguinal hernia  Procedure:                 Open repair right inguinal hernia with mesh procedure Armanda Heritage(Lichtenstein repair)  Surgeon:                     Angelia MouldHaywood M. Derrell LollingIngram, M.D., FACS  Assistant:                      RN FA  Operative Indications:   Adele BarthelMitchell Molenda is a 53 y.o. male. He is referred by Dr. Azalia BilisKevin Campos in the emergency department for evaluation and management of a symptomatic right inguinal hernia. He states that [facility] is his [facility] and manages his back pain and prescription medication. He has been seen at [facility] in the past by Junius ArgylePalonda Campbell, FNP as well.  He has noticed a painful bulge in his right groin for about 6 months. This is getting worse. No obvious incarceration. No prior history of hernia repair her hernia surgery. Examination reveals a moderately large right inguinal hernia that does not extend past the external ring. This is reducible with some effort when he is supine. No evidence of hernia on the left. Comorbidities include disability, permanent, for back pain. He's had lumbar disc surgery by Dr. Alveda Reasonsooke through a left retroperitoneal approach. He is seen in the emergency room frequently for back pain. He has allergic rhinitis and uses albuterol inhalers. He has hypertension and hyperlipidemia.  He lives alone. He is able to drive a car. He smokes a half a pack of cigarettes per day. Denies alcohol use.   Operative Findings:       The patient had a large indirect right inguinal hernia. This was the sliding hernia and which was gently dissected away from the surrounding tissues and reduced. There was no evidence of any other hernia.  Procedure in Detail:          Following the induction of general endotracheal anesthesia the patient's abdomen and genitalia were prepped and draped in  a sterile fashion. Intravenous antibiotics were given and a surgical time out performed. 0.5% Marcaine with epinephrine was used as local infiltration anesthetic.    A transverse incision was made in the right groin, overlying the inguinal canal. Dissection was carried down to the external oblique. The external oblique was incised in the direction of its fibers, opening up the external inguinal ring. The external oblique was dissected away from the underlying tissues and a self-retaining retractor placed. The cord structures were mobilized and encircled with a Penrose drain. Sensory nerves associated with the cord was separated traced back to its emergence from the muscle laterally, clamped, divided, and ligated with 2-0 silk tie. The redundant nerve medially was resected. I dissected the large indirect sac away from the cord structures to the  level of the internal ring. This was simply reduced and the internal ring was tightened laterally with interrupted sutures of 2-0 Vicryl. The floor of the inguinal canal was repaired and reinforced with an onlay graft of  3"  X  6"   ultra Pro mesh. The mesh was trimmed at the corners to accommodate the anatomy of  the wound. The mesh was sutured in place with running sutures of 2-0 Prolene and interrupted mattress sutures of 2-0 Prolene. The mesh was sutured so as to generously overlap the fascia at the pubic tubercle, and along the inguinal ligament inferiorly. Medially, superiorly, and superolaterally several  Prolene sutures were placed to secure the mesh. The mesh was incised laterally so as to wrap around the cord structures at the internal ring. The tails of the mesh were overlapped laterally  and a few other Prolene sutures were placed laterally. This provided a very secure repair both medially and laterally to the internal ring and allowed a small fingertip opening for the cord structures.  The wound was irrigated  With saline. The external oblique was closed with a  running suture of 2-0 Vicryl.. Scarpa's fascia was closed with 3-0 Vicryl. Skin was closed with subcuticular suture of 4-0 Monocryl and Dermabond. The patient tolerated the procedure well was taken to PACU in stable condition. EBL 10 cc. Counts correct. Outpatient.     Angelia Mould. Derrell Lolling, M.D., FACS General and Minimally Invasive Surgery Breast and Colorectal Surgery  09/17/2013 8:38 AM

## 2013-09-17 NOTE — Progress Notes (Signed)
On arrival to PACU, heart rate 40's, pt nauseated, BP low, pt turned to left side, HOB down, Dr. Renold DonGermeroth , anesthes, in; med given , IV fluids infusing

## 2013-09-17 NOTE — Anesthesia Preprocedure Evaluation (Signed)
Anesthesia Evaluation  Patient identified by MRN, date of birth, ID band Patient awake    Reviewed: Allergy & Precautions, H&P , NPO status , Patient's Chart, lab work & pertinent test results  Airway Mallampati: II TM Distance: >3 FB Neck ROM: Full    Dental  (+) Dental Advisory Given   Pulmonary asthma , Current Smoker,  breath sounds clear to auscultation        Cardiovascular hypertension, Pt. on medications Rhythm:Regular Rate:Normal     Neuro/Psych  Neuromuscular disease negative psych ROS   GI/Hepatic negative GI ROS, Neg liver ROS,   Endo/Other  negative endocrine ROS  Renal/GU negative Renal ROS     Musculoskeletal negative musculoskeletal ROS (+)   Abdominal   Peds  Hematology negative hematology ROS (+)   Anesthesia Other Findings   Reproductive/Obstetrics                           Anesthesia Physical Anesthesia Plan  ASA: II  Anesthesia Plan: General   Post-op Pain Management:    Induction: Intravenous  Airway Management Planned: Oral ETT and LMA  Additional Equipment:   Intra-op Plan:   Post-operative Plan: Extubation in OR  Informed Consent: I have reviewed the patients History and Physical, chart, labs and discussed the procedure including the risks, benefits and alternatives for the proposed anesthesia with the patient or authorized representative who has indicated his/her understanding and acceptance.   Dental advisory given  Plan Discussed with: CRNA  Anesthesia Plan Comments:         Anesthesia Quick Evaluation

## 2013-09-17 NOTE — Transfer of Care (Signed)
Immediate Anesthesia Transfer of Care Note  Patient: Carl Orozco  Procedure(s) Performed: Procedure(s): OPEN REPAIR RIGHT INGUINAL HERNIA WITH MESH (Right) INSERTION OF MESH (Right)  Patient Location: PACU  Anesthesia Type:General  Level of Consciousness: awake, oriented and patient cooperative  Airway & Oxygen Therapy: Patient Spontanous Breathing and Patient connected to face mask oxygen  Post-op Assessment: Report given to PACU RN and Post -op Vital signs reviewed and stable  Post vital signs: Hypotension & bradycardia treated by anesthesiologist.   Complications: persistent nausea and vomitingVomited on arrival to PACU . Ephedrine given by Dr. Renold DonGermeroth.

## 2013-09-17 NOTE — Progress Notes (Signed)
Blood pressure and heart rate up, nausea less, pain meds begun for pt's c/o surgical right groin pain

## 2013-09-17 NOTE — Anesthesia Postprocedure Evaluation (Signed)
Anesthesia Post Note  Patient: Carl Orozco  Procedure(s) Performed: Procedure(s) (LRB): OPEN REPAIR RIGHT INGUINAL HERNIA WITH MESH (Right) INSERTION OF MESH (Right)  Anesthesia type: General  Patient location: PACU  Post pain: Pain level controlled  Post assessment: Post-op Vital signs reviewed  Last Vitals: BP 124/74  Pulse 68  Temp(Src) 36.3 C (Oral)  Resp 16  Ht 5\' 10"  (1.778 m)  Wt 255 lb (115.667 kg)  BMI 36.59 kg/m2  SpO2 100%  Post vital signs: Reviewed  Level of consciousness: sedated  Complications: No apparent anesthesia complications

## 2013-09-17 NOTE — Interval H&P Note (Signed)
History and Physical Interval Note:  09/17/2013 7:13 AM  Carl Orozco  has presented today for surgery, with the diagnosis of right inguinal hernia  The goals and the various methods of treatment have been discussed with the patient and family. After consideration of risks, benefits and other options for treatment, the patient has consented to  Procedure(s): OPEN REPAIR RIGHT INGUINAL HERNIA WITH MESH (Right) INSERTION OF MESH (Right) as a surgical intervention .  The patient's history has been reviewed, patient examined, no change in status, stable for surgery.  I have reviewed the patient's chart and labs.  Questions were answered to the patient's satisfaction.     Ernestene MentionINGRAM,Shai Rasmussen M

## 2013-09-18 ENCOUNTER — Encounter (HOSPITAL_COMMUNITY): Payer: Self-pay | Admitting: General Surgery

## 2013-09-18 MED ORDER — POLYETHYLENE GLYCOL 3350 17 G PO PACK
17.0000 g | PACK | Freq: Every day | ORAL | Status: DC
  Filled 2013-09-18: qty 1

## 2013-09-18 MED ORDER — OXYCODONE-ACETAMINOPHEN 10-325 MG PO TABS: 1.0000 | ORAL_TABLET | ORAL | Status: DC | PRN

## 2013-09-18 NOTE — Discharge Summary (Signed)
Patient ID: Carl Orozco [id number] 52 y.o. 1960/12/12  Admit date: 09/17/2013  Discharge date and time: 09/18/2013  Admitting Physician: Ernestene Mention  Discharge Physician: Ernestene Mention  Admission Diagnoses: right inguinal hernia  Discharge Diagnoses: Right inguinal hernia Asymptomatic umbilical hernia History left retroperitoneal lumbar disc surgery Chronic back pain with permanent disability Hypertension Hyperlipidemia Allergic rhinitis  Operations: Procedure(s): OPEN REPAIR RIGHT INGUINAL HERNIA WITH MESH INSERTION OF MESH  Admission Condition: good  Discharged Condition: good  Indication for Admission: Carl Orozco is a 53 y.o. male. He is referred by Dr. Azalia Bilis in the emergency department for evaluation and management of a symptomatic right inguinal hernia. He states that Helyn App, MD at [facility] is his PCP and manages his back pain and prescription medication. He has been seen at [facility] in the past by Junius Argyle, FNP as well.  He has noticed a painful bulge in his right groin for about 6 months. This is getting worse. No obvious incarceration. No prior history of hernia repair her hernia surgery. Examination reveals a moderately large right inguinal hernia that does not extend past the external ring. This is reducible with some effort when he is supine. No evidence of hernia on the left.  Comorbidities include disability, permanent, for back pain. He's had lumbar disc surgery by Dr. Alveda Reasons through a left retroperitoneal approach. He is seen in the emergency room frequently for back pain. He has allergic rhinitis and uses albuterol inhalers. He has hypertension and hyperlipidemia.  He lives alone. He is able to drive a car. He smokes a half a pack of cigarettes per day. Denies alcohol use.   Hospital Course: On the day of admission the patient was taken to the operating room and underwent open repair of right inguinal hernia  with mesh. The surgery was uneventful. He lives alone and has very poor social support, and so we elected to keep him overnight. He did well. Appropriate pain. Tolerating diet. Voiding. Ambulatory. On postop day 1 he looked pretty good and was ready to go home. One of his church members was going to drive him home. Diet and activities were discussed. Prescription for Percocet was given. He also has other pain medicines at home. He is to return to see me in the office in 3 weeks.  Consults: None  Significant Diagnostic Studies: none  Treatments: surgery: Open repair right inguinal hernia with mesh  Disposition: Home  Patient Instructions:    Medication List         albuterol 108 (90 BASE) MCG/ACT inhaler  Commonly known as:  PROVENTIL HFA;VENTOLIN HFA  Inhale 2 puffs into the lungs every 6 (six) hours as needed for wheezing or shortness of breath. For shortness of breath     ezetimibe 10 MG tablet  Commonly known as:  ZETIA  Take 10 mg by mouth daily with breakfast.     fluticasone 50 MCG/ACT nasal spray  Commonly known as:  FLONASE  Place 2 sprays into the nose daily as needed for rhinitis or allergies.     oxyCODONE-acetaminophen 5-325 MG per tablet  Commonly known as:  PERCOCET/ROXICET  Take 1-2 tablets by mouth every 4 (four) hours as needed for severe pain.     oxyCODONE-acetaminophen 10-325 MG per tablet  Commonly known as:  PERCOCET  Take 1 tablet by mouth every 4 (four) hours as needed for pain.     simvastatin 10 MG tablet  Commonly known as:  ZOCOR  Take 1 tablet (10 mg  total) by mouth at bedtime.     valsartan-hydrochlorothiazide 320-25 MG per tablet  Commonly known as:  DIOVAN-HCT  Take 1 tablet by mouth daily with breakfast.        Activity: activity discussed in details. No driving for one week.nno lifting more than 15 pounds.  Encourage ambulation. Diet: regular diet Wound Care: none needed  Follow-up:  With Dr. Derrell LollingIngram in 3 weeks.  Signed: Angelia MouldHaywood M.  Derrell LollingIngram, M.D., FACS General and minimally invasive surgery Breast and Colorectal Surgery  09/18/2013, 5:56 AM

## 2013-09-18 NOTE — Progress Notes (Signed)
Discharge instructions reviewed with patient and he demonstrated understanding using teach back method.  Patient understands follow up and when he would need to notify the MD.  Allayne ButcherMiller, [facility]  09/18/2013  9:47 AM

## 2013-09-18 NOTE — Discharge Instructions (Signed)
-  see above 

## 2013-09-20 ENCOUNTER — Telehealth (INDEPENDENT_AMBULATORY_CARE_PROVIDER_SITE_OTHER): Payer: Self-pay

## 2013-09-20 ENCOUNTER — Ambulatory Visit (HOSPITAL_COMMUNITY)
Admission: RE | Admit: 2013-09-20 | Discharge: 2013-09-20 | Disposition: A | Discharge status: Home / Self Care | Payer: Medicare FFS | Source: Ambulatory Visit | Attending: General Surgery | Admitting: General Surgery

## 2013-09-20 ENCOUNTER — Ambulatory Visit (INDEPENDENT_AMBULATORY_CARE_PROVIDER_SITE_OTHER): Payer: Medicare HMO | Admitting: Surgery

## 2013-09-20 ENCOUNTER — Encounter (INDEPENDENT_AMBULATORY_CARE_PROVIDER_SITE_OTHER): Payer: Self-pay | Admitting: Surgery

## 2013-09-20 VITALS — BP 140/90 | HR 68 | Temp 98.1°F | Resp 14 | Ht 70.5 in | Wt 255.0 lb

## 2013-09-20 DIAGNOSIS — M79669 Pain in unspecified lower leg: Secondary | ICD-10-CM

## 2013-09-20 DIAGNOSIS — M79609 Pain in unspecified limb: Secondary | ICD-10-CM

## 2013-09-20 DIAGNOSIS — M7989 Other specified soft tissue disorders: Secondary | ICD-10-CM

## 2013-09-20 DIAGNOSIS — G9050 Complex regional pain syndrome I, unspecified: Secondary | ICD-10-CM

## 2013-09-20 MED ORDER — OXYCODONE-ACETAMINOPHEN 10-325 MG PO TABS: 1.0000 | ORAL_TABLET | ORAL | Status: DC | PRN

## 2013-09-20 NOTE — Progress Notes (Signed)
Adele BarthelMitchell Dewilde 53 y.o.  Body mass index is 36.06 kg/(m^2).  Patient Active Problem List   Diagnosis Date Noted  . Right inguinal hernia 09/02/2013  . Pure hypercholesterolemia 05/22/2013  . HYPERTENSION 06/29/2007  . SINUSITIS 06/29/2007  . LOW BACK PAIN, CHRONIC 06/29/2007    Allergies  Allergen Reactions  . Aspirin Hives  . Fish Allergy Hives  . Nsaids Hives    Past Surgical History  Procedure Laterality Date  . Back surgery    . Inguinal hernia repair Right 09/17/2013    Procedure: OPEN REPAIR RIGHT INGUINAL HERNIA WITH MESH;  Surgeon: Ernestene MentionHaywood M Ingram, MD;  Location: [facility];  Service: General;  Laterality: Right;  . Insertion of mesh Right 09/17/2013    Procedure: INSERTION OF MESH;  Surgeon: Ernestene MentionHaywood M Ingram, MD;  Location: [facility];  Service: General;  Laterality: Right;  . Hernia repair     Devra DoppHOWELL, TAMIEKA, MD 1. RSD (reflex sympathetic dystrophy)     Patient comes to the urgent office because of swelling in his left calf and tenderness. Lower extremity venous duplex was negative for deep venous thrombosis and no evidence of Baker cyst. On exam it is mildly swollen the left side but no palpable cords could be felt. He tells me this feels like the RSD that he had after his back surgery.  His foot is not painful or swollen.    His surgery was on the right side and seems to be getting along fine. He cannot take ibuprofen.  I refilled his prescription for Percocet 10/325 (30).  Have advised him to use a heating pad of his left leg and keep it elevated.  He will followup with Dr. Derrell LollingIngram postop on February 5.  Impression left lower extremity pain reminiscent of the patient's previous reflex sympathetic dystrophy. Will treat topically with heat and make sure he has narcotics to take as needed for pain Matt B. Daphine DeutscherMartin, MD, [facility] CenterFACS  [facility] [phone] beeper [phone]  09/20/2013 3:14 PM

## 2013-09-20 NOTE — Telephone Encounter (Signed)
Patient calling into office reporting that since yesterday he's having lower left extremity calf swelling and pain.  Patient s/p hernia repair surgery on 09/17/13.  Paged Dr. Derrell LollingIngram to review and rec'd verbal order for Doppler to rule out DVT (stat) and patient to be seen in Urgent Office today.

## 2013-09-20 NOTE — Patient Instructions (Signed)
Complex Regional Pain Syndrome Complex Regional Pain Syndrome (CRPS) is a nerve disorder that occurs at the site of an injury. It occurs especially after injuries from high-velocity impacts such as those from bullets or shrapnel. However, it may occur without apparent injury. The arms or legs are usually involved. SYMPTOMS  CRPS is a chronic condition characterized by:  Severe burning pain.  Changes in bone and skin.  Excessive sweating.  Tissue swelling.  Extreme sensitivity to touch. One visible sign of CRPS near the site of injury is warm, shiny, red skin that later becomes cool and bluish. The pain that patients report is out of proportion to the severity of the injury. The pain gets worse, rather than better, over time. Eventually the joints become stiff from disuse and the skin, muscles, and bone atrophy. The symptoms of CRPS vary in severity and duration. The cause of CRPS is unknown. The disorder is unique in that it simultaneously affects the:  Nerves.  Skin.  Muscles.  Blood vessels.  Bones. CRPS can strike at any age but is more common between the ages of 40 and 60. However, the number of CRPS cases among adolescents and young adults is increasing. CRPS is diagnosed primarily through observation of the symptoms. Some physicians use thermography to detect changes in body temperature that are common in CRPS. X-rays may also show changes in the bone.  TREATMENT  Treatments include:  Physicians use a variety of drugs to treat CRPS.  Elevation of the extremity and physical therapy are also used to treat CRPS.  Injection of a local anesthetic.  Applying brief pulses of electricity to nerve endings under the skin (transcutaneous electrical stimulation or TENS).  In some cases, interruption of the affected portion of the sympathetic nervous system (surgical or chemical sympathectomy) is necessary to relieve pain. This involves cutting the nerve or nerves. Pain is destroyed  almost instantly, but this treatment may also destroy other sensations. PROGNOSIS Good progress can be made in treating CRPS if treatment is begun early. Ideally treatment should begin within 3 months of the first symptoms. Early treatment often results in remission. If treatment is delayed, the disorder can quickly spread to the entire limb, and changes in bone and muscle may become irreversible. In 50 percent of CRPS cases, pain persists longer than 6 months and sometimes for years. Document Released: 08/05/2002 Document Revised: 11/07/2011 Document Reviewed: 06/25/2008 ExitCare Patient Information 2014 [employer].  

## 2013-09-20 NOTE — Progress Notes (Signed)
*  Preliminary Results* Left lower extremity venous duplex completed. Left lower extremity is negative for deep vein thrombosis. There is no evidence of left Baker's cyst.  09/20/2013 2:43 PM  Gertie FeyMichelle Rushawn Capshaw, RVT, RDCS, RDMS

## 2013-09-25 ENCOUNTER — Telehealth (INDEPENDENT_AMBULATORY_CARE_PROVIDER_SITE_OTHER): Payer: Self-pay | Admitting: General Surgery

## 2013-09-25 NOTE — Telephone Encounter (Signed)
Pt called for refill of pain medication.  Has already had one refill, per his report, but describes pain at surgical site and Lt leg.  Aware he must pick up Rx at the office if Dr. Derrell LollingIngram will agrees.  Please advise.

## 2013-09-25 NOTE — Telephone Encounter (Signed)
I approve second refill, but no further without office visit.  hmi

## 2013-10-03 ENCOUNTER — Encounter (INDEPENDENT_AMBULATORY_CARE_PROVIDER_SITE_OTHER): Payer: Medicare Other | Admitting: General Surgery

## 2013-10-11 ENCOUNTER — Emergency Department (HOSPITAL_COMMUNITY)
Admission: EM | Admit: 2013-10-11 | Discharge: 2013-10-11 | Disposition: A | Discharge status: Home / Self Care | Payer: Medicare FFS | Attending: Emergency Medicine | Admitting: Emergency Medicine

## 2013-10-11 ENCOUNTER — Encounter (HOSPITAL_COMMUNITY): Payer: Self-pay | Admitting: Emergency Medicine

## 2013-10-11 DIAGNOSIS — Z8739 Personal history of other diseases of the musculoskeletal system and connective tissue: Secondary | ICD-10-CM | POA: Insufficient documentation

## 2013-10-11 DIAGNOSIS — Z760 Encounter for issue of repeat prescription: Secondary | ICD-10-CM

## 2013-10-11 DIAGNOSIS — Z79899 Other long term (current) drug therapy: Secondary | ICD-10-CM | POA: Insufficient documentation

## 2013-10-11 DIAGNOSIS — E7800 Pure hypercholesterolemia, unspecified: Secondary | ICD-10-CM | POA: Insufficient documentation

## 2013-10-11 DIAGNOSIS — Z9889 Other specified postprocedural states: Secondary | ICD-10-CM | POA: Insufficient documentation

## 2013-10-11 DIAGNOSIS — R109 Unspecified abdominal pain: Secondary | ICD-10-CM

## 2013-10-11 DIAGNOSIS — Z8669 Personal history of other diseases of the nervous system and sense organs: Secondary | ICD-10-CM | POA: Insufficient documentation

## 2013-10-11 DIAGNOSIS — M79609 Pain in unspecified limb: Secondary | ICD-10-CM | POA: Insufficient documentation

## 2013-10-11 DIAGNOSIS — F17200 Nicotine dependence, unspecified, uncomplicated: Secondary | ICD-10-CM | POA: Insufficient documentation

## 2013-10-11 DIAGNOSIS — Z86718 Personal history of other venous thrombosis and embolism: Secondary | ICD-10-CM | POA: Insufficient documentation

## 2013-10-11 DIAGNOSIS — I10 Essential (primary) hypertension: Secondary | ICD-10-CM | POA: Insufficient documentation

## 2013-10-11 DIAGNOSIS — Z8719 Personal history of other diseases of the digestive system: Secondary | ICD-10-CM | POA: Insufficient documentation

## 2013-10-11 DIAGNOSIS — J45909 Unspecified asthma, uncomplicated: Secondary | ICD-10-CM | POA: Insufficient documentation

## 2013-10-11 DIAGNOSIS — Z872 Personal history of diseases of the skin and subcutaneous tissue: Secondary | ICD-10-CM | POA: Insufficient documentation

## 2013-10-11 DIAGNOSIS — IMO0002 Reserved for concepts with insufficient information to code with codable children: Secondary | ICD-10-CM | POA: Insufficient documentation

## 2013-10-11 MED ORDER — OXYCODONE-ACETAMINOPHEN 5-325 MG PO TABS: 1.0000 | ORAL_TABLET | Freq: Four times a day (QID) | ORAL | Status: DC | PRN

## 2013-10-11 NOTE — ED Provider Notes (Signed)
CSN: [id number]     Arrival date & time 10/11/13  0122 History   First MD Initiated Contact with Patient 10/11/13 0208     Chief Complaint  Patient presents with  . Abdominal Pain  . Leg Pain     (Consider location/radiation/quality/duration/timing/severity/associated sxs/prior Treatment) HPI Comments: She is a 53 year old male who presents to the emergency department requesting a medication refill on his pain medication after having abdominal surgery. Patient had a right inguinal hernia repair 09/17/2013 by Dr. Derrell LollingIngram. States he ran out of his pain medication the other day. No new pain. He has a followup on March 4. He is taking stool softeners and able to move his bowels. No nausea or vomiting. Patient also states he has had pain in his left leg and foot since after having surgery, had a negative Doppler ultrasound, was told it may be his RSD acting up, has a followup with his primary care physician next week.  Patient is a 53 y.o. male presenting with abdominal pain and leg pain. The history is provided by the patient.  Abdominal Pain Leg Pain   Past Medical History  Diagnosis Date  . Hypertension   . High cholesterol   . Arthritis   . Neuromuscular disorder   . Asthma   . Numbness     left leg  . History of DVT of lower extremity 2011    post op back surg  . Eczema   . Inguinal hernia     right   Past Surgical History  Procedure Laterality Date  . Back surgery    . Inguinal hernia repair Right 09/17/2013    Procedure: OPEN REPAIR RIGHT INGUINAL HERNIA WITH MESH;  Surgeon: Ernestene MentionHaywood M Ingram, MD;  Location: [facility];  Service: General;  Laterality: Right;  . Insertion of mesh Right 09/17/2013    Procedure: INSERTION OF MESH;  Surgeon: Ernestene MentionHaywood M Ingram, MD;  Location: [facility];  Service: General;  Laterality: Right;  . Hernia repair     Family History  Problem Relation Age of Onset  . Hypertension Mother    History  Substance Use Topics  . Smoking status: Current Some Day  Smoker -- 0.50 packs/day    Types: Cigarettes  . Smokeless tobacco: Never Used  . Alcohol Use: No    Review of Systems  Gastrointestinal: Positive for abdominal pain.  Musculoskeletal:       Positive for left leg pain.  All other systems reviewed and are negative.      Allergies  Aspirin; Fish allergy; and Nsaids  Home Medications   Current Outpatient Rx  Name  Route  Sig  Dispense  Refill  . albuterol (PROVENTIL HFA;VENTOLIN HFA) 108 (90 BASE) MCG/ACT inhaler   Inhalation   Inhale 2 puffs into the lungs every 6 (six) hours as needed for wheezing or shortness of breath. For shortness of breath   1 Inhaler   5   . ezetimibe (ZETIA) 10 MG tablet   Oral   Take 10 mg by mouth daily with breakfast.          . fluticasone (FLONASE) 50 MCG/ACT nasal spray   Nasal   Place 2 sprays into the nose daily as needed for rhinitis or allergies.   16 g   5   . oxyCODONE-acetaminophen (PERCOCET) 10-325 MG per tablet   Oral   Take 1 tablet by mouth every 4 (four) hours as needed for pain.   30 tablet   0   . simvastatin (  ZOCOR) 10 MG tablet   Oral   Take 1 tablet (10 mg total) by mouth at bedtime.   30 tablet   5   . valsartan-hydrochlorothiazide (DIOVAN-HCT) 320-25 MG per tablet   Oral   Take 1 tablet by mouth daily with breakfast.          . oxyCODONE-acetaminophen (PERCOCET) 5-325 MG per tablet   Oral   Take 1-2 tablets by mouth every 6 (six) hours as needed for severe pain.   10 tablet   0    BP 132/95  Pulse 79  Temp(Src) 98.9 F (37.2 C) (Oral)  Resp 14  SpO2 100% Physical Exam  Nursing note and vitals reviewed. Constitutional: He is oriented to person, place, and time. He appears well-developed and well-nourished. No distress.  HENT:  Head: Normocephalic and atraumatic.  Mouth/Throat: Oropharynx is clear and moist.  Eyes: Conjunctivae are normal.  Neck: Normal range of motion. Neck supple.  Cardiovascular: Normal rate, regular rhythm, normal heart  sounds and intact distal pulses.   Pulmonary/Chest: Effort normal and breath sounds normal.  Abdominal: Soft. Bowel sounds are normal. He exhibits no distension and no mass. There is tenderness (mild). There is no rigidity, no rebound and no guarding.    No peritoneal signs.  Musculoskeletal: Normal range of motion. He exhibits no edema.  Neurological: He is alert and oriented to person, place, and time.  Skin: Skin is warm and dry. He is not diaphoretic.  Psychiatric: He has a normal mood and affect. His behavior is normal.    ED Course  Procedures (including critical care time) Labs Review Labs Reviewed - No data to display Imaging Review No results found.  EKG Interpretation   None       MDM   Final diagnoses:  Medication refill  Abdominal pain    Pt requesting refill on pain medication after surgery. No concerns of abdominal pain. No peritoneal signs. Patient states he has no new pain. Has followup with surgeon and PCP. Medication refilled. Patient stable for discharge.    Trevor Mace, PA-C 10/11/13 [phone]

## 2013-10-11 NOTE — Discharge Instructions (Signed)
Take percocet for severe pain only. No driving or operating heavy machinery while taking percocet. This medication may cause drowsiness. [facility] with [facility] and your primary care doctor as scheduled.  Medication Refill, Emergency Department We have refilled your medication today as a courtesy to you. It is best for your medical care, however, to take care of getting refills done through your primary caregiver's office. They have your records and can do a better job of follow-up than we can in the emergency department. On maintenance medications, we often only prescribe enough medications to get you by until you are able to see your regular caregiver. This is a more expensive way to refill medications. In the future, please plan for refills so that you will not have to use the emergency department for this. Thank you for your help. Your help allows us to better take care of the daily emergencies that enter our department. Document Released: 12/02/2003 Document Revised: 11/07/2011 Document Reviewed: 08/15/2005 [facility] Patient Information 2014 [place], [place].  Abdominal Pain, Adult Many things can cause abdominal pain. Usually, abdominal pain is not caused by a disease and will improve without treatment. It can often be observed and treated at home. Your health care provider will do a physical exam and possibly order blood tests and X-rays to help determine the seriousness of your pain. However, in many cases, more time must pass before a clear cause of the pain can be found. Before that point, your health care provider may not know if you need more testing or further treatment. HOME CARE INSTRUCTIONS  Monitor your abdominal pain for any changes. The following actions may help to alleviate any discomfort you are experiencing:  Only take over-the-counter or prescription medicines as directed by your health care provider.  Do not take laxatives unless directed to do so by your  health care provider.  Try a clear liquid diet (broth, tea, or water) as directed by your health care provider. Slowly move to a bland diet as tolerated. SEEK MEDICAL CARE IF:  You have unexplained abdominal pain.  You have abdominal pain associated with nausea or diarrhea.  You have pain when you urinate or have a bowel movement.  You experience abdominal pain that wakes you in the night.  You have abdominal pain that is worsened or improved by eating food.  You have abdominal pain that is worsened with eating fatty foods. SEEK IMMEDIATE MEDICAL CARE IF:   Your pain does not go away within 2 hours.  You have a fever.  You keep throwing up (vomiting).  Your pain is felt only in portions of the abdomen, such as the right side or the left lower portion of the abdomen.  You pass bloody or black tarry stools. MAKE SURE YOU:  Understand these instructions.   Will watch your condition.   Will get help right away if you are not doing well or get worse.  Document Released: 05/25/2005 Document Revised: 06/05/2013 Document Reviewed: 04/24/2013 [facility] CenterExit[facility] Patient Information 2014 [place]Care, [place].

## 2013-10-11 NOTE — ED Notes (Signed)
Pt presents with c/o right sided abdominal pain and left leg pain that goes from his left hip down to below his calf area. Pt had hernia surgery several weeks ago and the pain in his abdominal area is where his surgery was, has a follow up appt next week. Pt says he has seen some left leg swelling since the surgery and it has gotten worse over the past few days.

## 2013-10-12 NOTE — ED Provider Notes (Signed)
Medical screening examination/treatment/procedure(s) were performed by non-physician practitioner and as supervising physician I was immediately available for consultation/collaboration.  EKG Interpretation   None        Derwood KaplanAnkit Kirti Carl, MD 10/12/13 [phone]

## 2013-10-15 ENCOUNTER — Encounter (HOSPITAL_COMMUNITY): Payer: Self-pay | Admitting: Emergency Medicine

## 2013-10-15 ENCOUNTER — Emergency Department (HOSPITAL_COMMUNITY)
Admission: EM | Admit: 2013-10-15 | Discharge: 2013-10-15 | Disposition: A | Discharge status: Home / Self Care | Payer: Medicare FFS | Attending: Emergency Medicine | Admitting: Emergency Medicine

## 2013-10-15 DIAGNOSIS — F17200 Nicotine dependence, unspecified, uncomplicated: Secondary | ICD-10-CM | POA: Insufficient documentation

## 2013-10-15 DIAGNOSIS — Z86718 Personal history of other venous thrombosis and embolism: Secondary | ICD-10-CM | POA: Insufficient documentation

## 2013-10-15 DIAGNOSIS — K029 Dental caries, unspecified: Secondary | ICD-10-CM | POA: Insufficient documentation

## 2013-10-15 DIAGNOSIS — Z8739 Personal history of other diseases of the musculoskeletal system and connective tissue: Secondary | ICD-10-CM | POA: Insufficient documentation

## 2013-10-15 DIAGNOSIS — K002 Abnormalities of size and form of teeth: Secondary | ICD-10-CM | POA: Insufficient documentation

## 2013-10-15 DIAGNOSIS — I10 Essential (primary) hypertension: Secondary | ICD-10-CM | POA: Insufficient documentation

## 2013-10-15 DIAGNOSIS — K08109 Complete loss of teeth, unspecified cause, unspecified class: Secondary | ICD-10-CM | POA: Insufficient documentation

## 2013-10-15 DIAGNOSIS — E7800 Pure hypercholesterolemia, unspecified: Secondary | ICD-10-CM | POA: Insufficient documentation

## 2013-10-15 DIAGNOSIS — K047 Periapical abscess without sinus: Secondary | ICD-10-CM | POA: Insufficient documentation

## 2013-10-15 DIAGNOSIS — Z8669 Personal history of other diseases of the nervous system and sense organs: Secondary | ICD-10-CM | POA: Insufficient documentation

## 2013-10-15 DIAGNOSIS — Z872 Personal history of diseases of the skin and subcutaneous tissue: Secondary | ICD-10-CM | POA: Insufficient documentation

## 2013-10-15 DIAGNOSIS — J45909 Unspecified asthma, uncomplicated: Secondary | ICD-10-CM | POA: Insufficient documentation

## 2013-10-15 DIAGNOSIS — IMO0002 Reserved for concepts with insufficient information to code with codable children: Secondary | ICD-10-CM | POA: Insufficient documentation

## 2013-10-15 DIAGNOSIS — Z79899 Other long term (current) drug therapy: Secondary | ICD-10-CM | POA: Insufficient documentation

## 2013-10-15 MED ORDER — TRAMADOL HCL 50 MG PO TABS: 50.0000 mg | ORAL_TABLET | Freq: Four times a day (QID) | ORAL | Status: DC | PRN

## 2013-10-15 MED ORDER — PENICILLIN V POTASSIUM 500 MG PO TABS: 500.0000 mg | ORAL_TABLET | Freq: Four times a day (QID) | ORAL | Status: DC

## 2013-10-15 NOTE — ED Notes (Signed)
Pt a+ox4, presents with c/o R lower dental pain since last night, "shooting" in nature, 9/10, worsening with food/drink.  Pt denies fevers/chills or other complaints.  Speaking full/clear sentences, no difficulty clearing secretions or maintaining airway.  Skin PWD.  MAEI.  NAD.

## 2013-10-15 NOTE — ED Provider Notes (Signed)
CSN: [id number]     Arrival date & time 10/15/13  1450 History  This chart was scribed for Junius FinnerErin O'Malley, PA working with Raeford RazorStephen Kohut, MD by Quintella ReichertMatthew Underwood, ED Scribe. This patient was seen in room WTR5/WTR5 and the patient's care was started at 4:18 PM.   Chief Complaint  Patient presents with  . Dental Pain    The history is provided by the patient. No language interpreter was used.    HPI Comments: Carl Orozco is a 53 y.o. male with h/o HTN and high cholesterol who presents to the Emergency Department complaining of right lower dental pain that began last night.  Pt states that yesterday he was eating and noticed some blood coming out of his mouth.  He did not break a tooth to his knowledge.  Since then he has had severe shooting 9/10 pain to that area whenever he eats or drinks.  He has not attempted to treat pain pta.  He denies fever.  Pt does not have a dentist.  He smokes occasionally.  He does not drink.     Past Medical History  Diagnosis Date  . Hypertension   . High cholesterol   . Arthritis   . Neuromuscular disorder   . Asthma   . Numbness     left leg  . History of DVT of lower extremity 2011    post op back surg  . Eczema   . Inguinal hernia     right    Past Surgical History  Procedure Laterality Date  . Back surgery    . Inguinal hernia repair Right 09/17/2013    Procedure: OPEN REPAIR RIGHT INGUINAL HERNIA WITH MESH;  Surgeon: Ernestene MentionHaywood M Ingram, MD;  Location: [facility];  Service: General;  Laterality: Right;  . Insertion of mesh Right 09/17/2013    Procedure: INSERTION OF MESH;  Surgeon: Ernestene MentionHaywood M Ingram, MD;  Location: [facility];  Service: General;  Laterality: Right;  . Hernia repair      Family History  Problem Relation Age of Onset  . Hypertension Mother     History  Substance Use Topics  . Smoking status: Current Some Day Smoker -- 0.50 packs/day    Types: Cigarettes  . Smokeless tobacco: Never Used  . Alcohol Use: No     Review of  Systems  Constitutional: Negative for fever.  HENT: Positive for dental problem.   All other systems reviewed and are negative.      Allergies  Aspirin; Fish allergy; and Nsaids  Home Medications   Current Outpatient Rx  Name  Route  Sig  Dispense  Refill  . albuterol (PROVENTIL HFA;VENTOLIN HFA) 108 (90 BASE) MCG/ACT inhaler   Inhalation   Inhale 2 puffs into the lungs every 6 (six) hours as needed for wheezing or shortness of breath. For shortness of breath   1 Inhaler   5   . ezetimibe (ZETIA) 10 MG tablet   Oral   Take 10 mg by mouth at bedtime.          . fluticasone (FLONASE) 50 MCG/ACT nasal spray   Nasal   Place 2 sprays into the nose daily as needed for rhinitis or allergies.   16 g   5   . oxyCODONE-acetaminophen (PERCOCET) 10-325 MG per tablet   Oral   Take 1 tablet by mouth every 4 (four) hours as needed for pain.   30 tablet   0   . oxyCODONE-acetaminophen (PERCOCET) 5-325 MG per tablet  Oral   Take 1-2 tablets by mouth every 6 (six) hours as needed for severe pain.   10 tablet   0   . simvastatin (ZOCOR) 10 MG tablet   Oral   Take 1 tablet (10 mg total) by mouth at bedtime.   30 tablet   5   . valsartan-hydrochlorothiazide (DIOVAN-HCT) 320-25 MG per tablet   Oral   Take 1 tablet by mouth daily with breakfast.          . penicillin v potassium (VEETID) 500 MG tablet   Oral   Take 1 tablet (500 mg total) by mouth 4 (four) times daily.   40 tablet   0   . traMADol (ULTRAM) 50 MG tablet   Oral   Take 1 tablet (50 mg total) by mouth every 6 (six) hours as needed.   15 tablet   0    BP 135/74  Pulse 88  Temp(Src) 98.5 F (36.9 C) (Oral)  Resp 16  SpO2 100%  Physical Exam  Nursing note and vitals reviewed. Constitutional: He is oriented to person, place, and time. He appears well-developed and well-nourished.  HENT:  Head: Normocephalic and atraumatic.  Nose: Nose normal.  Mouth/Throat: Uvula is midline, oropharynx is clear  and moist and mucous membranes are normal. He does not have dentures. No oral lesions. No trismus in the jaw. Abnormal dentition. Dental abscesses and dental caries present. No uvula swelling or lacerations.    Multiple dental caries and missing teeth. Significant gingival disease along right front lower tooth, #26. Tenderness to gingiva. No induration, bleeding or discharge. No drainable abscess present.    Eyes: EOM are normal.  Neck: Normal range of motion. Neck supple.  Cardiovascular: Normal rate.   Pulmonary/Chest: Effort normal.  Musculoskeletal: Normal range of motion.  Neurological: He is alert and oriented to person, place, and time.  Skin: Skin is warm and dry.  Psychiatric: He has a normal mood and affect. His behavior is normal.    ED Course  Procedures (including critical care time)  DIAGNOSTIC STUDIES: Oxygen Saturation is 100% on room air, normal by my interpretation.    COORDINATION OF CARE: 4:20 PM-Discussed treatment plan which includes pain medication, antibiotics, and dental f/u with pt at bedside and pt agreed to plan.     Labs Review Labs Reviewed - No data to display  Imaging Review No results found.  EKG Interpretation   None       MDM   Final diagnoses:  Pain due to dental caries    Multiple dental caries, c/o dental pain. No drainable abscess seen on exam. Pt appears well, non-toxic. No respiratory distress. Neck is supple. Rx: tramadol and PCN. Advised to f/u with dentist. Dental resource guide provided. Return precautions given. Pt verbalized understanding and agreement with tx plan.   I personally performed the services described in this documentation, which was scribed in my presence. The recorded information has been reviewed and is accurate.    Junius Finner, PA-C 10/15/13 1630

## 2013-10-15 NOTE — Discharge Instructions (Signed)
Dental Pain °Toothache is pain in or around a tooth. It may get worse with chewing or with cold or heat.  °HOME CARE °· Your dentist may use a numbing medicine during treatment. If so, you may need to avoid eating until the medicine wears off. Ask your dentist about this. °· Only take medicine as told by your dentist or doctor. °· Avoid chewing food near the painful tooth until after all treatment is done. Ask your dentist about this. °GET HELP RIGHT AWAY IF:  °· The problem gets worse or new problems appear. °· You have a fever. °· There is redness and puffiness (swelling) of the face, jaw, or neck. °· You cannot open your mouth. °· There is pain in the jaw. °· There is very bad pain that is not helped by medicine. °MAKE SURE YOU:  °· Understand these instructions. °· Will watch your condition. °· Will get help right away if you are not doing well or get worse. °Document Released: 02/01/2008 Document Revised: 11/07/2011 Document Reviewed: 02/01/2008 °ExitCare® Patient Information ©2014 [employer]. ° °Dental Caries °Dental caries is tooth decay. This decay can cause a hole in teeth (cavity) that can get bigger and deeper over time. °HOME CARE °· Brush and floss your teeth. Do this at least two times a day. °· Use a fluoride toothpaste. °· Use a mouth rinse if told by your dentist or doctor. °· Eat less sugary and starchy foods. Drink less sugary drinks. °· Avoid snacking often on sugary and starchy foods. Avoid sipping often on sugary drinks. °· Keep regular checkups and cleanings with your dentist. °· Use fluoride supplements if told by your dentist or doctor. °· Allow fluoride to be applied to teeth if told by your dentist or doctor. °MAKE SURE YOU: °· Understand these instructions. °· Will watch your condition. °· Will get help right away if you are not doing well or get worse. °Document Released: 05/24/2008 Document Revised: 04/17/2013 Document Reviewed: 08/17/2012 °ExitCare® Patient Information ©2014  [employer]. ° °Emergency Department Resource Guide °1) Find a Doctor and Pay Out of Pocket °Although you won't have to find out who is covered by your insurance plan, it is a good idea to ask around and get recommendations. You will then need to call the office and see if the doctor you have chosen will accept you as a new patient and what types of options they offer for patients who are self-pay. Some doctors offer discounts or will set up payment plans for their patients who do not have insurance, but you will need to ask so you aren't surprised when you get to your appointment. ° °2) Contact Your Local Health Department °Not all health departments have doctors that can see patients for sick visits, but many do, so it is worth a call to see if yours does. If you don't know where your local health department is, you can check in your phone book. The CDC also has a tool to help you locate your state's health department, and many state websites also have listings of all of their local health departments. ° °3) Find a Walk-in Clinic °If your illness is not likely to be very severe or complicated, you may want to try a walk in clinic. These are popping up all over the country in pharmacies, drugstores, and shopping centers. They're usually staffed by nurse practitioners or physician assistants that have been trained to treat common illnesses and complaints. They're usually fairly quick and inexpensive. However, if   you have serious medical issues or chronic medical problems, these are probably not your best option. ° °No Primary Care Doctor: °- Call [facility] at  832-8000 - they can help you locate a primary care doctor that  accepts your insurance, provides certain services, etc. °- Physician Referral Service- 1-800-533-3463 ° °Chronic Pain Problems: °Organization         Address  Phone   Notes  °[facility]  ([phone]) [phone] Patients need to be referred by their primary care doctor.   ° °Medication Assistance: °Organization         Address  Phone   Notes  °[place] [facility] [address] °[place], [place] [address] (336) 641-8030 --Must be a resident of [place] °-- Must have NO insurance coverage whatsoever (no Medicaid/ Medicare, etc.) °-- The pt. MUST have a primary care doctor that directs their care regularly and follows them in the community °  °[facility]  (866) 331-1348   °[employer]  (888) 892-1162   ° °Agencies that provide inexpensive medical care: °Organization         Address                                                       Phone                                                                            Notes  °[facility]  (336) 832-8035   °[facility]    (336) 832-7272   °[facility] [address] °[place], [place] [address] (336) 832-4777   °[facility] [address], °[place] (336) 271-4999   °[facility]    (336) 373-0678   °[facility]    (336) 272-1050   °[facility] ° [address], [place] Phone:  (336) 832-4444, Fax:  (336) 832-4440 Hours of Operation:  9 am - 6 pm, M-F.  Also accepts Medicaid/Medicare and self-pay.  ° [facility] ° [address], [place] Phone: (336) [phone], Fax: (336) [phone]. Hours of Operation:  8:30 am - 5:30 pm, M-F.  Also accepts Medicaid and self-pay.  °[facility] [place] [address], [place] Phone: (336) [phone]   °[facility] [address], [place], [place] (336)723-1848, Ext. 123 Mondays & Thursdays: 7-9 AM.  First 15 patients are seen on a first come, first serve basis. °  ° °Medicaid-accepting [place] Providers: ° °Organization         Address                                                                         Phone                               Notes  °[facility] [address] [place]  [address], [place] ([phone]) [phone] Also accepts self-pay patients.  °[facility] [address], [place] ° (336) [phone]   °[facility] [address], [place] (336) 288-8857   °[facility] [address] [place] [address], [place] (336) [phone]   °[facility] [address], [place]  ° (336) [phone] Only accepts [place] Access Medicaid patients after they have their name applied to their card.  ° °Self-Pay (no insurance) in [place]: °  °Organization         Address                                                     Phone               Notes  °Sickle Cell Patients, [facility] [address], [place] (336) [phone]   °[facility] [address], [place] (336) [phone]   °[facility] ° [address], [place] (336) [phone]   °[facility]/Dr. Osei-Bonsu ° [address] [place] [address], [place] or [address], [place] ([phone]) [phone] Phone number for both [place] and [place] locations is the same.  °[facility] [address], [place] (336) [phone]   °[facility] [address] [place] [address], [place] or [address] (336) 852-7530 °(336) 878-2260   °[facility] [address], [place] (336) 350-1642, phone; (336) 294-5005, fax Sees patients 1st and 3rd Saturday of every month.  Must not qualify for public or private insurance (i.e. Medicaid, Medicare, [place] Health Choice, Veterans' Benefits) • Household income should be no more than 200% of the poverty level •The clinic cannot treat you if you are pregnant or think you are pregnant • Sexually transmitted diseases are not treated at the clinic.  ° °Dental Care: °Organization         Address                                  Phone                       Notes  °[place] [facility] [address],  (336) 641-6152 Accepts children up to age 21 who are enrolled in Medicaid or [place] Health Choice; pregnant women with a Medicaid card; and children who have applied for Medicaid or [place] Health Choice, but were declined, whose parents can pay a reduced fee at time of service.  °[facility] [place]  [address], [place] (336) 641-7733 Accepts children up to age 21 who are enrolled in Medicaid or [place] Health Choice; pregnant women with a Medicaid card; and children who have applied for Medicaid or [place] Health Choice, but were declined, whose parents can pay a reduced fee at time of   service.  °[facility] ° [address], [place] (336) 641-4533 Patients are seen by appointment only. Walk-ins are not accepted. [facility] will see patients 18 years of age and older. °Monday - Tuesday (8am-5pm) °Most Wednesdays (8:30-5pm) °$30 per visit, cash only  °[facility] ° [address], [place] ([phone]) [phone] Patients are seen by appointment only. Walk-ins are not accepted. [facility] will see patients 18 years of age and older. °One Wednesday Evening (Monthly: Volunteer Based).  $30 per visit, cash only  °[facility]  (919) 537-3737 for adults; Children under age 4, call Graduate Pediatric Dentistry at ([phone]) [phone]. Children aged 4-14, please call ([phone]) [phone] to request a pediatric application. ° Dental services are provided in all areas of dental care including fillings, crowns and bridges, complete and partial dentures, implants, gum treatment, root canals, and extractions. Preventive care is also provided. Treatment is provided to both adults and children. °Patients are selected via a lottery and there is often a waiting list. °  °[facility] [address], °[place] ° (336) 763-8833 [email or web address] °   °[facility] [address], [place], [place] (336)723-1848, Ext. 123 Second and Fourth Thursday of each month, opens at 6:30 AM; Clinic ends at 9 AM.  Patients are seen on a first-come first-served basis, and a limited number are seen during each clinic.  ° °[facility] ° [address], [place], [place] (336) [phone]   Eligibility Requirements °You must have lived in [place], [place], or [place] counties for at least the last three months. °  You cannot be eligible for state or federal sponsored healthcare insurance, including Veterans Administration, Medicaid, or Medicare. °  You generally cannot be eligible for healthcare insurance through your employer.  °  How to apply: °Eligibility screenings are held every Tuesday and Wednesday afternoon from 1:00 pm until 4:00 pm. You do not need an appointment for the interview!  °[facility] [address], [place], [place] [phone]   °[facility]  [phone]   °[facility]  [phone]   °[facility]  [phone]   ° °

## 2013-10-19 NOTE — ED Provider Notes (Signed)
Medical screening examination/treatment/procedure(s) were performed by non-physician practitioner and as supervising physician I was immediately available for consultation/collaboration.  EKG Interpretation   None        Flor Whitacre, MD 10/19/13 0948 

## 2013-10-29 ENCOUNTER — Encounter (INDEPENDENT_AMBULATORY_CARE_PROVIDER_SITE_OTHER): Payer: Medicare HMO | Admitting: General Surgery

## 2013-10-31 ENCOUNTER — Encounter (INDEPENDENT_AMBULATORY_CARE_PROVIDER_SITE_OTHER): Payer: Self-pay | Admitting: General Surgery

## 2014-10-02 ENCOUNTER — Ambulatory Visit (HOSPITAL_COMMUNITY)
Admission: RE | Admit: 2014-10-02 | Discharge: 2014-10-02 | Disposition: A | Discharge status: Home / Self Care | Payer: Medicare Other | Source: Ambulatory Visit | Attending: Physical Medicine and Rehabilitation | Admitting: Physical Medicine and Rehabilitation

## 2014-10-02 ENCOUNTER — Other Ambulatory Visit (HOSPITAL_COMMUNITY): Payer: Self-pay | Admitting: Physical Medicine and Rehabilitation

## 2014-10-02 DIAGNOSIS — R200 Anesthesia of skin: Secondary | ICD-10-CM | POA: Insufficient documentation

## 2014-10-02 DIAGNOSIS — M25552 Pain in left hip: Secondary | ICD-10-CM | POA: Diagnosis not present

## 2014-10-02 DIAGNOSIS — R202 Paresthesia of skin: Secondary | ICD-10-CM | POA: Diagnosis not present

## 2015-02-09 ENCOUNTER — Other Ambulatory Visit: Payer: Self-pay | Admitting: Physical Medicine and Rehabilitation

## 2015-02-09 DIAGNOSIS — M5417 Radiculopathy, lumbosacral region: Secondary | ICD-10-CM

## 2015-02-17 ENCOUNTER — Other Ambulatory Visit: Payer: Medicare FFS

## 2015-02-27 ENCOUNTER — Ambulatory Visit
Admission: RE | Admit: 2015-02-27 | Discharge: 2015-02-27 | Disposition: A | Discharge status: Home / Self Care | Payer: Medicare Other | Source: Ambulatory Visit | Attending: Physical Medicine and Rehabilitation | Admitting: Physical Medicine and Rehabilitation

## 2015-02-27 DIAGNOSIS — M5417 Radiculopathy, lumbosacral region: Secondary | ICD-10-CM

## 2015-02-27 MED ORDER — GADOBENATE DIMEGLUMINE 529 MG/ML IV SOLN
20.0000 mL | Freq: Once | INTRAVENOUS | Status: AC | PRN
  Administered 2015-02-27: 20 mL via INTRAVENOUS

## 2016-03-11 ENCOUNTER — Ambulatory Visit (HOSPITAL_COMMUNITY)
Admission: RE | Admit: 2016-03-11 | Discharge: 2016-03-11 | Disposition: A | Discharge status: Home / Self Care | Payer: Medicare Other | Source: Ambulatory Visit | Attending: Physical Medicine and Rehabilitation | Admitting: Physical Medicine and Rehabilitation

## 2016-03-11 ENCOUNTER — Other Ambulatory Visit (HOSPITAL_COMMUNITY): Payer: Self-pay | Admitting: Physical Medicine and Rehabilitation

## 2016-03-11 DIAGNOSIS — M25571 Pain in right ankle and joints of right foot: Secondary | ICD-10-CM

## 2016-03-11 DIAGNOSIS — R937 Abnormal findings on diagnostic imaging of other parts of musculoskeletal system: Secondary | ICD-10-CM | POA: Diagnosis not present

## 2016-03-11 DIAGNOSIS — M7731 Calcaneal spur, right foot: Secondary | ICD-10-CM | POA: Insufficient documentation

## 2016-08-01 ENCOUNTER — Other Ambulatory Visit (HOSPITAL_COMMUNITY): Payer: Self-pay | Admitting: Physical Medicine and Rehabilitation

## 2016-08-01 ENCOUNTER — Ambulatory Visit (HOSPITAL_COMMUNITY)
Admission: RE | Admit: 2016-08-01 | Discharge: 2016-08-01 | Disposition: A | Discharge status: Home / Self Care | Payer: Medicare Other | Source: Ambulatory Visit | Attending: Physical Medicine and Rehabilitation | Admitting: Physical Medicine and Rehabilitation

## 2016-08-01 DIAGNOSIS — M25512 Pain in left shoulder: Secondary | ICD-10-CM | POA: Diagnosis not present

## 2016-10-03 ENCOUNTER — Other Ambulatory Visit (HOSPITAL_COMMUNITY): Payer: Self-pay | Admitting: Physical Medicine and Rehabilitation

## 2016-10-03 ENCOUNTER — Ambulatory Visit (HOSPITAL_COMMUNITY)
Admission: RE | Admit: 2016-10-03 | Discharge: 2016-10-03 | Disposition: A | Discharge status: Home / Self Care | Payer: Medicare Other | Source: Ambulatory Visit | Attending: Physical Medicine and Rehabilitation | Admitting: Physical Medicine and Rehabilitation

## 2016-10-03 DIAGNOSIS — M25552 Pain in left hip: Secondary | ICD-10-CM | POA: Diagnosis present

## 2016-10-03 DIAGNOSIS — M160 Bilateral primary osteoarthritis of hip: Secondary | ICD-10-CM | POA: Insufficient documentation

## 2016-10-03 DIAGNOSIS — M25551 Pain in right hip: Secondary | ICD-10-CM

## 2017-01-12 ENCOUNTER — Ambulatory Visit (HOSPITAL_COMMUNITY)
Admission: RE | Admit: 2017-01-12 | Discharge: 2017-01-12 | Disposition: A | Discharge status: Home / Self Care | Payer: Medicare Other | Source: Ambulatory Visit | Attending: Physical Medicine and Rehabilitation | Admitting: Physical Medicine and Rehabilitation

## 2017-01-12 ENCOUNTER — Other Ambulatory Visit (HOSPITAL_COMMUNITY): Payer: Self-pay | Admitting: Physical Medicine and Rehabilitation

## 2017-01-12 ENCOUNTER — Encounter (HOSPITAL_COMMUNITY): Payer: Self-pay | Admitting: Emergency Medicine

## 2017-01-12 ENCOUNTER — Emergency Department (HOSPITAL_COMMUNITY)
Admission: EM | Admit: 2017-01-12 | Discharge: 2017-01-12 | Disposition: A | Discharge status: Home / Self Care | Payer: Medicare Other | Attending: Emergency Medicine | Admitting: Emergency Medicine

## 2017-01-12 DIAGNOSIS — F17210 Nicotine dependence, cigarettes, uncomplicated: Secondary | ICD-10-CM | POA: Diagnosis not present

## 2017-01-12 DIAGNOSIS — R202 Paresthesia of skin: Secondary | ICD-10-CM | POA: Insufficient documentation

## 2017-01-12 DIAGNOSIS — J45909 Unspecified asthma, uncomplicated: Secondary | ICD-10-CM | POA: Diagnosis not present

## 2017-01-12 DIAGNOSIS — M25521 Pain in right elbow: Secondary | ICD-10-CM

## 2017-01-12 DIAGNOSIS — M778 Other enthesopathies, not elsewhere classified: Secondary | ICD-10-CM | POA: Insufficient documentation

## 2017-01-12 DIAGNOSIS — R200 Anesthesia of skin: Secondary | ICD-10-CM

## 2017-01-12 DIAGNOSIS — R52 Pain, unspecified: Secondary | ICD-10-CM

## 2017-01-12 DIAGNOSIS — Z79899 Other long term (current) drug therapy: Secondary | ICD-10-CM | POA: Diagnosis not present

## 2017-01-12 DIAGNOSIS — I10 Essential (primary) hypertension: Secondary | ICD-10-CM | POA: Insufficient documentation

## 2017-01-12 LAB — CBG MONITORING, ED: GLUCOSE-CAPILLARY: 89 mg/dL (ref 65–99)

## 2017-01-12 MED ORDER — CYCLOBENZAPRINE HCL 10 MG PO TABS: 10.0000 mg | ORAL_TABLET | Freq: Two times a day (BID) | ORAL | 0 refills | Status: DC | PRN

## 2017-01-12 NOTE — ED Triage Notes (Signed)
Patient here from home with complaints of right sided arm weakness. Reports that right arm becomes weak in which he is unable to move it at times. Denies trouble swallowing, denies difficulty ambulating.

## 2017-01-12 NOTE — Discharge Instructions (Signed)
Your blood sugar was normal. Try sleeping on your back and do not lay no your arms. You may try taking the flexeril for muscle relaxation. Please follow up with primary care doctor to make sure you do not need imaging of your neck. Have given you a referrral to neurology. Return to the ED if he develop worsening symptoms, vision changes, headache or for any reason.

## 2017-01-12 NOTE — ED Provider Notes (Signed)
Medical screening examination/treatment/procedure(s) were conducted as a shared visit with non-physician practitioner(s) and myself.  I personally evaluated the patient during the encounter.   EKG Interpretation None     56 year old male complains of one-month history of intermittent right arm paresthesias that occurs when he sleeps at night. States that he feels if he has to shake his arm for her to start working. Denies any stroke or TIA symptoms. On exam he has no neurological deficits. He has good radial pulse. Median ulnar and radial nerve appears be intact. He has had some paresthesias going on the left arm. Suspect that he has a cervical disc problem and will be given referral to neurology   Lorre NickAllen, Kasson Lamere, MD 01/12/17 1726

## 2017-01-19 NOTE — ED Provider Notes (Signed)
[facility]EMERGENCY DEPT Provider Note   CSN: [id number] Arrival date & time: 01/12/17  1600     History   Chief Complaint Chief Complaint  Patient presents with  . Extremity Weakness    HPI Carl Orozco is a 56 y.o. male.  HPI 56 year old African-American male past medical history significant for asthma, high cholesterol, hypertension, numbness and left leg due to neuromuscular disorder presents to the emergency department today with complaints of intermittent right arm paresthesias that occurs when he sleeps at night for the past one month. The patient states that one month ago when he woke up from work he felt like his right arm was numb and weak and had to shake it to get the blood flowing back to his arm. States that whenever he shakes his arm that helps start working again. States this happened 2-3 times since that event. Patient states it is only when he wakes up in the morning after properly sleeping on his arm. Does not complain of any other weakness or numbness in the other extremity. Denies any other stroke or TIA symptoms. Patient denies any fever, chills, headache, vision changes, neck pain, chest pain, shortness of breath, lightheadedness, dizziness, abdominal pain, urinary symptoms, change in bowel habits, lower extremity paresthesias. Patient has not tried nothing for symptoms. Has not tried anything for his symptoms today. Past Medical History:  Diagnosis Date  . Arthritis   . Asthma   . Eczema   . High cholesterol   . History of DVT of lower extremity 2011   post op back surg  . Hypertension   . Inguinal hernia    right  . Neuromuscular disorder (HCC)   . Numbness    left leg    Patient Active Problem List   Diagnosis Date Noted  . Right inguinal hernia 09/02/2013  . Pure hypercholesterolemia 05/22/2013  . HYPERTENSION 06/29/2007  . SINUSITIS 06/29/2007  . LOW BACK PAIN, CHRONIC 06/29/2007    Past Surgical History:  Procedure Laterality Date  . BACK  SURGERY    . HERNIA REPAIR    . INGUINAL HERNIA REPAIR Right 09/17/2013   Procedure: OPEN REPAIR RIGHT INGUINAL HERNIA WITH MESH;  Surgeon: Ernestene Mention, MD;  Location: [facility];  Service: General;  Laterality: Right;  . INSERTION OF MESH Right 09/17/2013   Procedure: INSERTION OF MESH;  Surgeon: Ernestene Mention, MD;  Location: [facility];  Service: General;  Laterality: Right;       Home Medications    Prior to Admission medications   Medication Sig Start Date End Date Taking? Authorizing Provider  albuterol (PROVENTIL HFA;VENTOLIN HFA) 108 (90 BASE) MCG/ACT inhaler Inhale 2 puffs into the lungs every 6 (six) hours as needed for wheezing or shortness of breath. For shortness of breath 05/22/13  Yes Worthy Rancher B, FNP  baclofen (LIORESAL) 10 MG tablet Take 10 mg by mouth daily as needed for muscle spasms. 11/07/16  Yes [provider]  ezetimibe (ZETIA) 10 MG tablet Take 10 mg by mouth at bedtime.    Yes [provider]  fluticasone (FLONASE) 50 MCG/ACT nasal spray Place 2 sprays into the nose daily as needed for rhinitis or allergies. 05/22/13  Yes Worthy Rancher B, FNP  oxyCODONE-acetaminophen (PERCOCET) 10-325 MG per tablet Take 1 tablet by mouth every 4 (four) hours as needed for pain. 09/20/13  Yes Luretha Murphy, MD  simvastatin (ZOCOR) 10 MG tablet Take 1 tablet (10 mg total) by mouth at bedtime. 05/22/13  Yes Worthy Rancher B,  FNP  triamcinolone cream (KENALOG) 0.1 % Apply 1 application topically 2 (two) times daily as needed. 01/06/17  Yes [provider]  valsartan-hydrochlorothiazide (DIOVAN-HCT) 320-25 MG per tablet Take 1 tablet by mouth daily with breakfast.  05/29/13  Yes Worthy Rancher B, FNP  cyclobenzaprine (FLEXERIL) 10 MG tablet Take 1 tablet (10 mg total) by mouth 2 (two) times daily as needed for muscle spasms. 01/12/17   Rise Mu, PA-C  oxyCODONE-acetaminophen (PERCOCET) 5-325 MG per tablet Take 1-2 tablets by mouth every 6 (six) hours as  needed for severe pain. Patient not taking: Reported on 01/12/2017 10/11/13   Kathrynn Speed, PA-C  penicillin v potassium (VEETID) 500 MG tablet Take 1 tablet (500 mg total) by mouth 4 (four) times daily. Patient not taking: Reported on 01/12/2017 10/15/13   Junius Finner, PA-C  traMADol (ULTRAM) 50 MG tablet Take 1 tablet (50 mg total) by mouth every 6 (six) hours as needed. Patient not taking: Reported on 01/12/2017 10/15/13   Junius Finner, PA-C    Family History Family History  Problem Relation Age of Onset  . Hypertension Mother     Social History Social History  Substance Use Topics  . Smoking status: Current Some Day Smoker    Packs/day: 0.50    Types: Cigarettes  . Smokeless tobacco: Never Used  . Alcohol use No     Allergies   Aspirin; Fish allergy; and Nsaids   Review of Systems Review of Systems  Constitutional: Negative for chills and fever.  HENT: Negative for congestion.   Eyes: Negative for photophobia and visual disturbance.  Respiratory: Negative for cough and shortness of breath.   Cardiovascular: Negative for chest pain.  Gastrointestinal: Negative for abdominal pain, diarrhea, nausea and vomiting.  Genitourinary: Negative for dysuria, flank pain, frequency, hematuria and urgency.  Musculoskeletal: Negative for back pain and neck pain.  Neurological: Positive for weakness and numbness. Negative for dizziness, light-headedness and headaches.     Physical Exam Updated Vital Signs BP 119/81   Pulse 88   Temp 98.5 F (36.9 C)   Resp 16   SpO2 99%   Physical Exam  Constitutional: He is oriented to person, place, and time. He appears well-developed and well-nourished. No distress.  HENT:  Head: Normocephalic and atraumatic.  Mouth/Throat: Oropharynx is clear and moist.  Eyes: Conjunctivae and EOM are normal. Pupils are equal, round, and reactive to light. Right eye exhibits no discharge. Left eye exhibits no discharge. No scleral icterus.  Neck:  Normal range of motion. Neck supple. No thyromegaly present.  Cardiovascular: Normal rate, regular rhythm, normal heart sounds and intact distal pulses.   Pulmonary/Chest: Effort normal and breath sounds normal.  Abdominal: Soft. Bowel sounds are normal. He exhibits no distension. There is no tenderness. There is no rebound and no guarding.  Musculoskeletal: Normal range of motion.  Radial pulses are 2+ bilaterally. Strength bilateral upper extremities bilaterally.  Lymphadenopathy:    He has no cervical adenopathy.  Neurological: He is alert and oriented to person, place, and time.  The patient is alert, attentive, and oriented x 3. Speech is clear. Cranial nerve II-VII grossly intact. Negative pronator drift. Sensation intact. Strength 5/5 in all extremities. Reflexes 2+ and symmetric at biceps, triceps, knees, and ankles. Rapid alternating movement and fine finger movements intact. Romberg is absent. Posture and gait normal.   Skin: Skin is warm and dry. Capillary refill takes less than 2 seconds.  Nursing note and vitals reviewed.    ED Treatments /  Results  Labs (all labs ordered are listed, but only abnormal results are displayed) Labs Reviewed  CBG MONITORING, ED    EKG  EKG Interpretation None       Radiology No results found.  Procedures Procedures (including critical care time)  Medications Ordered in ED Medications - No data to display   Initial Impression / Assessment and Plan / ED Course  I have reviewed the triage vital signs and the nursing notes.  Pertinent labs & imaging results that were available during my care of the patient were reviewed by me and considered in my medical decision making (see chart for details).     Patient resents to the emergency department today complaining of one month history of intermittent right arm paresthesias that occurs when he sleeps at night is improved after shaking his arm in the morning. Denies any symptoms at this  time. Patient is neurovascularly intact. Radial pulses are strong and equal bilaterally. Strength is equal bilaterally. Patient does have mild paresthesias going up right arm. Suspect that this is a cervical disc problem not related to CVA given the length of symptoms and normal neuro exam at this time. Doubt TIA. We'll give neurology referral. Patient asked that we check his blood sugar which was normal. Pt is hemodynamically stable, in NAD, & able to ambulate in the ED. Pain has been managed & has no complaints prior to dc. Pt is comfortable with above plan and is stable for discharge at this time. All questions were answered prior to disposition. Strict return precautions for f/u to the ED were discussed. PT seen by Dr. Freida BusmanAllen who is agreeable to the above plan.   Final Clinical Impressions(s) / ED Diagnoses   Final diagnoses:  Numbness and tingling of right arm    New Prescriptions Discharge Medication List as of 01/12/2017  6:01 PM    START taking these medications   Details  cyclobenzaprine (FLEXERIL) 10 MG tablet Take 1 tablet (10 mg total) by mouth 2 (two) times daily as needed for muscle spasms., Starting Thu 01/12/2017, Print         Demetrios LollLeaphart, Gavrielle Streck T, PA-C 01/19/17 1015

## 2017-02-23 ENCOUNTER — Other Ambulatory Visit: Payer: Self-pay | Admitting: Physical Medicine and Rehabilitation

## 2017-02-23 DIAGNOSIS — M5412 Radiculopathy, cervical region: Secondary | ICD-10-CM

## 2017-03-09 ENCOUNTER — Ambulatory Visit
Admission: RE | Admit: 2017-03-09 | Discharge: 2017-03-09 | Disposition: A | Discharge status: Home / Self Care | Payer: Medicare Other | Source: Ambulatory Visit | Attending: Physical Medicine and Rehabilitation | Admitting: Physical Medicine and Rehabilitation

## 2017-03-09 DIAGNOSIS — M5412 Radiculopathy, cervical region: Secondary | ICD-10-CM

## 2017-12-19 DIAGNOSIS — Z5321 Procedure and treatment not carried out due to patient leaving prior to being seen by health care provider: Secondary | ICD-10-CM | POA: Insufficient documentation

## 2017-12-19 DIAGNOSIS — M542 Cervicalgia: Secondary | ICD-10-CM | POA: Diagnosis present

## 2017-12-20 ENCOUNTER — Ambulatory Visit (HOSPITAL_COMMUNITY)
Admission: EM | Admit: 2017-12-20 | Discharge: 2017-12-20 | Disposition: A | Discharge status: Home / Self Care | Payer: Medicare Other | Attending: Family Medicine | Admitting: Family Medicine

## 2017-12-20 ENCOUNTER — Encounter (HOSPITAL_COMMUNITY): Payer: Self-pay | Admitting: Emergency Medicine

## 2017-12-20 ENCOUNTER — Other Ambulatory Visit: Payer: Self-pay

## 2017-12-20 ENCOUNTER — Emergency Department (HOSPITAL_COMMUNITY)
Admission: EM | Admit: 2017-12-20 | Discharge: 2017-12-20 | Discharge status: Against Medical Advice | Payer: Medicare Other | Attending: Emergency Medicine | Admitting: Emergency Medicine

## 2017-12-20 DIAGNOSIS — M75101 Unspecified rotator cuff tear or rupture of right shoulder, not specified as traumatic: Secondary | ICD-10-CM

## 2017-12-20 DIAGNOSIS — M25511 Pain in right shoulder: Secondary | ICD-10-CM

## 2017-12-20 DIAGNOSIS — IMO0002 Reserved for concepts with insufficient information to code with codable children: Secondary | ICD-10-CM

## 2017-12-20 MED ORDER — PREDNISONE 20 MG PO TABS: ORAL_TABLET | ORAL | 0 refills | Status: DC

## 2017-12-20 MED ORDER — BUPIVACAINE HCL (PF) 0.5 % IJ SOLN
INTRAMUSCULAR | Status: AC
  Filled 2017-12-20: qty 10

## 2017-12-20 MED ORDER — METHYLPREDNISOLONE ACETATE 80 MG/ML IJ SUSP
INTRAMUSCULAR | Status: AC
  Filled 2017-12-20: qty 1

## 2017-12-20 NOTE — ED Triage Notes (Signed)
Pt is c/o right shoulder and neck pain that started 2 weeks ago and has progressively been getting worse  Pt was seen by his PCP on Tuesday morning and was given Voltaren cream and he states he used it once and it helped for a while but he went to bed the pain was so bad he could not sleep  Pt states the pain goes from his neck into his shoulder and down his arm into his thumb

## 2017-12-20 NOTE — ED Triage Notes (Signed)
Pt here for right shoulder pain chronic in nature

## 2017-12-20 NOTE — ED Notes (Signed)
Pt told registration clerk he was leaving 

## 2017-12-20 NOTE — ED Provider Notes (Signed)
[facility]   [id number] 12/20/17 Arrival Time: 1633   SUBJECTIVE:  Carl Orozco is a 57 y.o. male who presents to the urgent care with complaint of right shoulder pain chronic in nature that began two weeks ago.  No injury.  Pain radiates from trapezius area over right shoulder down to the thumb area.  Neck ROM is slightly uncomfortable, but shoulder movement is very painful  Patient is a retired [employer] architecttruck driver.  Past Medical History:  Diagnosis Date  . Arthritis   . Asthma   . Eczema   . High cholesterol   . History of DVT of lower extremity 2011   post op back surg  . Hypertension   . Inguinal hernia    right  . Neuromuscular disorder (HCC)   . Numbness    left leg   Family History  Problem Relation Age of Onset  . Hypertension Mother    Social History   Socioeconomic History  . Marital status: Single    Spouse name: Not on file  . Number of children: Not on file  . Years of education: Not on file  . Highest education level: Not on file  Occupational History  . Not on file  Social Needs  . Financial resource strain: Not on file  . Food insecurity:    Worry: Not on file    Inability: Not on file  . Transportation needs:    Medical: Not on file    Non-medical: Not on file  Tobacco Use  . Smoking status: Current Some Day Smoker    Packs/day: 0.50    Types: Cigarettes  . Smokeless tobacco: Never Used  Substance and Sexual Activity  . Alcohol use: No  . Drug use: No  . Sexual activity: Not on file  Lifestyle  . Physical activity:    Days per week: Not on file    Minutes per session: Not on file  . Stress: Not on file  Relationships  . Social connections:    Talks on phone: Not on file    Gets together: Not on file    Attends religious service: Not on file    Active member of club or organization: Not on file    Attends meetings of clubs or organizations: Not on file    Relationship status: Not on file  . Intimate partner violence:   Fear of current or ex partner: Not on file    Emotionally abused: Not on file    Physically abused: Not on file    Forced sexual activity: Not on file  Other Topics Concern  . Not on file  Social History Narrative  . Not on file   No outpatient medications have been marked as taking for the 12/20/17 encounter [facility](Hospital Encounter).   Allergies  Allergen Reactions  . Aspirin Hives  . Fish Allergy Hives  . Nsaids Hives      ROS: As per HPI, remainder of ROS negative.   OBJECTIVE:   Vitals:   12/20/17 1700  BP: 131/81  Pulse: 74  Resp: 18  Temp: 98.4 F (36.9 C)  TempSrc: Oral  SpO2: 96%     General appearance: alert; no distress Eyes: PERRL; EOMI; conjunctiva normal HENT: normocephalic; atraumatic;  Neck: supple; nontender. Back: no CVA tenderness Extremities: no cyanosis or edema; symmetrical with no gross deformities; very tender anterior right shoulder joint line.  Slow but full ROM right shoulder Skin: warm and dry Neurologic: normal gait; grossly normal Psychological: alert and cooperative;  normal mood and affect    Labs:  Results for orders placed or performed during the hospital encounter of 01/12/17  POC CBG, ED  Result Value Ref Range   Glucose-Capillary 89 65 - 99 mg/dL    Labs Reviewed - No data to display  No results found.     ASSESSMENT & PLAN:  1. Disorder of rotator cuff syndrome of shoulder and allied disorder, right     Meds ordered this encounter  Medications  . predniSONE (DELTASONE) 20 MG tablet    Sig: Two daily with food    Dispense:  10 tablet    Refill:  0    Reviewed expectations re: course of current medical issues. Questions answered. Outlined signs and symptoms indicating need for more acute intervention. Patient verbalized understanding. After Visit Summary given.    Procedures:  Right shoulder injected with marcaine and depomedrol, 1 cc of 0.5% and 80 mg respectively.  50% improvement.     Elvina Sidle, MD 12/20/17 1736

## 2018-05-05 ENCOUNTER — Other Ambulatory Visit: Payer: Self-pay

## 2018-05-05 ENCOUNTER — Emergency Department (HOSPITAL_COMMUNITY): Payer: Medicare Other

## 2018-05-05 ENCOUNTER — Emergency Department (HOSPITAL_COMMUNITY)
Admission: EM | Admit: 2018-05-05 | Discharge: 2018-05-05 | Disposition: A | Discharge status: Home / Self Care | Payer: Medicare Other | Attending: Emergency Medicine | Admitting: Emergency Medicine

## 2018-05-05 ENCOUNTER — Encounter (HOSPITAL_COMMUNITY): Payer: Self-pay | Admitting: Emergency Medicine

## 2018-05-05 DIAGNOSIS — J189 Pneumonia, unspecified organism: Secondary | ICD-10-CM | POA: Diagnosis not present

## 2018-05-05 DIAGNOSIS — Z79899 Other long term (current) drug therapy: Secondary | ICD-10-CM | POA: Insufficient documentation

## 2018-05-05 DIAGNOSIS — F17210 Nicotine dependence, cigarettes, uncomplicated: Secondary | ICD-10-CM | POA: Insufficient documentation

## 2018-05-05 DIAGNOSIS — I10 Essential (primary) hypertension: Secondary | ICD-10-CM | POA: Insufficient documentation

## 2018-05-05 DIAGNOSIS — J45909 Unspecified asthma, uncomplicated: Secondary | ICD-10-CM | POA: Diagnosis not present

## 2018-05-05 DIAGNOSIS — R05 Cough: Secondary | ICD-10-CM | POA: Diagnosis present

## 2018-05-05 MED ORDER — AMOXICILLIN-POT CLAVULANATE 875-125 MG PO TABS: 1.0000 | ORAL_TABLET | Freq: Two times a day (BID) | ORAL | 0 refills | Status: AC

## 2018-05-05 MED ORDER — DOXYCYCLINE HYCLATE 100 MG PO CAPS: 100.0000 mg | ORAL_CAPSULE | Freq: Two times a day (BID) | ORAL | 0 refills | Status: AC

## 2018-05-05 NOTE — Discharge Instructions (Signed)
You were given a prescription for antibiotics. Please take the antibiotic prescription fully.   Please follow up with your primary care provider within 5-7 days for re-evaluation of your symptoms. If you do not have a primary care provider, information for a healthcare clinic has been provided for you to make arrangements for follow up care. Please return to the emergency department for any new or worsening symptoms.  

## 2018-05-05 NOTE — ED Triage Notes (Signed)
Patient here from home with complaints of cough, nasal congestion x3 days. Green sputum.

## 2018-05-05 NOTE — ED Provider Notes (Signed)
[facility]-EMERGENCY DEPT Provider Note   CSN: [id number] Arrival date & time: 05/05/18  1310     History   Chief Complaint Chief Complaint  Patient presents with  . Nasal Congestion  . Cough    HPI Qualin Vaziri is a 57 y.o. male.  HPI   Pt is a 57 y/o male with a h/o asthma, eczema, HLD, HTN, DVT, who presents to the ED today c/o URI sxs. He states that for the last 3 days he has had a productive cough, rhinorrhea, congestion, sore throat. He reports sweats, but has not had a documented fever at home. He has tried taking OTC cough/cold medications without relief. Has chest wall pain with coughing but no constant chest pain. Also reports wheezing and some mild SOB that is improved with his inhalers.   Past Medical History:  Diagnosis Date  . Arthritis   . Asthma   . Eczema   . High cholesterol   . History of DVT of lower extremity 2011   post op back surg  . Hypertension   . Inguinal hernia    right  . Neuromuscular disorder (HCC)   . Numbness    left leg    Patient Active Problem List   Diagnosis Date Noted  . Right inguinal hernia 09/02/2013  . Pure hypercholesterolemia 05/22/2013  . HYPERTENSION 06/29/2007  . SINUSITIS 06/29/2007  . LOW BACK PAIN, CHRONIC 06/29/2007    Past Surgical History:  Procedure Laterality Date  . BACK SURGERY    . HERNIA REPAIR    . INGUINAL HERNIA REPAIR Right 09/17/2013   Procedure: OPEN REPAIR RIGHT INGUINAL HERNIA WITH MESH;  Surgeon: Ernestene Mention, MD;  Location: [facility];  Service: General;  Laterality: Right;  . INSERTION OF MESH Right 09/17/2013   Procedure: INSERTION OF MESH;  Surgeon: Ernestene Mention, MD;  Location: [facility];  Service: General;  Laterality: Right;        Home Medications    Prior to Admission medications   Medication Sig Start Date End Date Taking? Authorizing Provider  albuterol (PROVENTIL HFA;VENTOLIN HFA) 108 (90 BASE) MCG/ACT inhaler Inhale 2 puffs into the lungs every 6  (six) hours as needed for wheezing or shortness of breath. For shortness of breath 05/22/13   Worthy Rancher B, FNP  amoxicillin-clavulanate (AUGMENTIN) 875-125 MG tablet Take 1 tablet by mouth every 12 (twelve) hours for 7 days. 05/05/18 05/12/18  Destini Cambre S, PA-C  baclofen (LIORESAL) 10 MG tablet Take 10 mg by mouth daily as needed for muscle spasms. 11/07/16   [provider]  doxycycline (VIBRAMYCIN) 100 MG capsule Take 1 capsule (100 mg total) by mouth 2 (two) times daily for 7 days. 05/05/18 05/12/18  Kato Wieczorek S, PA-C  ezetimibe (ZETIA) 10 MG tablet Take 10 mg by mouth at bedtime.     [provider]  fluticasone (FLONASE) 50 MCG/ACT nasal spray Place 2 sprays into the nose daily as needed for rhinitis or allergies. 05/22/13   Eulis Foster, FNP  predniSONE (DELTASONE) 20 MG tablet Two daily with food 12/20/17   Elvina Sidle, MD  simvastatin (ZOCOR) 10 MG tablet Take 1 tablet (10 mg total) by mouth at bedtime. 05/22/13   Worthy Rancher B, FNP  triamcinolone cream (KENALOG) 0.1 % Apply 1 application topically 2 (two) times daily as needed. 01/06/17   [provider]  valsartan-hydrochlorothiazide (DIOVAN-HCT) 320-25 MG per tablet Take 1 tablet by mouth daily with breakfast.  05/29/13   Hyman Hopes,  Marylouise Stacks, FNP    Family History Family History  Problem Relation Age of Onset  . Hypertension Mother     Social History Social History   Tobacco Use  . Smoking status: Current Some Day Smoker    Packs/day: 0.50    Types: Cigarettes  . Smokeless tobacco: Never Used  Substance Use Topics  . Alcohol use: No  . Drug use: No     Allergies   Aspirin; Fish allergy; and Nsaids   Review of Systems Review of Systems  Constitutional: Positive for diaphoresis. Negative for chills and fever.  HENT: Positive for congestion, rhinorrhea and sore throat. Negative for ear pain and postnasal drip.   Eyes: Negative for pain and visual disturbance.  Respiratory: Positive  for cough and wheezing.   Cardiovascular: Negative for chest pain.       Chest wall pain with cough only  Gastrointestinal: Negative for abdominal pain, constipation, diarrhea, nausea and vomiting.  Genitourinary: Negative for dysuria and hematuria.  Musculoskeletal: Negative for back pain.  Skin: Negative for color change and rash.  Neurological: Negative for headaches.  All other systems reviewed and are negative.  Physical Exam Updated Vital Signs BP 113/85 (BP Location: Left Arm)   Pulse 63   Temp 99.2 F (37.3 C) (Oral)   Resp 18   SpO2 100%   Physical Exam  Constitutional: He appears well-developed and well-nourished.  HENT:  Head: Normocephalic and atraumatic.  Eyes: Conjunctivae are normal.  Neck: Neck supple.  Cardiovascular: Normal rate and regular rhythm.  No murmur heard. Pulmonary/Chest: Effort normal. No respiratory distress.  Faint crackles to RLL. No wheezing. No tachypnea. Speaking in full sentences.   Abdominal: Soft. Bowel sounds are normal. There is no tenderness. There is no guarding.  Musculoskeletal: He exhibits no edema.  Neurological: He is alert.  Skin: Skin is warm and dry.  Psychiatric: He has a normal mood and affect.  Nursing note and vitals reviewed.    ED Treatments / Results  Labs (all labs ordered are listed, but only abnormal results are displayed) Labs Reviewed - No data to display  EKG None  Radiology Dg Chest 2 View  Result Date: 05/05/2018 CLINICAL DATA:  Productive cough.  Night sweats.  Nasal congestion. EXAM: CHEST - 2 VIEW COMPARISON:  Two-view chest x-ray 05/12/2013. FINDINGS: The heart size is normal. Ill-defined right lower lobe airspace disease is present. Lung volumes are somewhat low. There is no edema or effusion. No focal airspace disease is present on the left. The visualized soft tissues and bony thorax are unremarkable. IMPRESSION: Ill-defined right lower lobe airspace disease concerning for early pneumonia.  Electronically Signed   By: Marin Roberts M.D.   On: 05/05/2018 14:55    Procedures Procedures (including critical care time)  Medications Ordered in ED Medications - No data to display   Initial Impression / Assessment and Plan / ED Course  I have reviewed the triage vital signs and the nursing notes.  Pertinent labs & imaging results that were available during my care of the patient were reviewed by me and considered in my medical decision making (see chart for details).   Final Clinical Impressions(s) / ED Diagnoses   Final diagnoses:  Community acquired pneumonia, unspecified laterality   Patient has been diagnosed with CAP via chest xray. Pt is not ill appearing, immunocompromised, and is afebrile. He is satting at 100% on RA and therefore I feel like the they can be treated as an OP with abx therapy.  Pt has been advised to return to the ED if symptoms worsen or they do not improve. Pt verbalizes understanding and is agreeable with plan.   ED Discharge Orders         Ordered    amoxicillin-clavulanate (AUGMENTIN) 875-125 MG tablet  Every 12 hours     05/05/18 1504    doxycycline (VIBRAMYCIN) 100 MG capsule  2 times daily     05/05/18 1504           Kimi Kroft S, PA-C 05/05/18 1506    Sabas Sous, MD 05/05/18 1639

## 2018-07-04 ENCOUNTER — Encounter (HOSPITAL_BASED_OUTPATIENT_CLINIC_OR_DEPARTMENT_OTHER): Payer: Self-pay | Admitting: *Deleted

## 2018-07-04 ENCOUNTER — Other Ambulatory Visit: Payer: Self-pay

## 2018-07-04 NOTE — Progress Notes (Signed)
Spoke w/ pt via phone for pre-op interview.  Npo after mn.  Arrive at 0830.  Needs istat 8 and ekg.  Asked to bring rescue inhaler.

## 2018-07-08 NOTE — H&P (Addendum)
Carl Orozco is an 57 y.o. male.   Chief Complaint: [name] with decompensated chronic intermittent  Exotropia complains of eye fatigue and eye wandering. HPI: 57 y/o BM c symptomatic strabismus, intermittent diplopia asthenopia and blurred vision for elective repair of strabismus .  Past Medical History:  Diagnosis Date  . Arthritis   . Asthma   . Eczema   . 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 Hyperlipidemia   . Hypertension   . Pre-diabetes   . Strabismus     Past Surgical History:  Procedure Laterality Date  . INGUINAL HERNIA REPAIR Right 09/17/2013   Procedure: OPEN REPAIR RIGHT INGUINAL HERNIA WITH MESH;  Surgeon: Ernestene Mention, MD;  Location: [facility];  Service: General;  Laterality: Right;  . INSERTION OF MESH Right 09/17/2013   Procedure: INSERTION OF MESH;  Surgeon: Ernestene Mention, MD;  Location: [facility];  Service: General;  Laterality: Right;  . LUMBAR DISC SURGERY  05-20-2008   dr Alveda Reasons @[facility]    L4-5, L5-S1    Family History  Problem Relation Age of Onset  . Hypertension Mother    Social History:  reports that he has been smoking cigarettes. He has a 5.00 pack-year smoking history. He has never used smokeless tobacco. He reports that he does not drink alcohol or use drugs.  Allergies:  Allergies  Allergen Reactions  . Aspirin Hives  . Fish Allergy Hives    No medications prior to admission.    No results found for this or any previous visit (from the past 48 hour(s)). No results found.  Review of Systems  Constitutional: Negative.   HENT: Negative.   Eyes: Positive for blurred vision and double vision.  Respiratory: Negative.   Cardiovascular: Negative.   Gastrointestinal: Negative.   Genitourinary: Negative.   Musculoskeletal: Negative.   Neurological: Negative.   Endo/Heme/Allergies: Negative.   Psychiatric/Behavioral: Negative.     Height 5' 10.5" (1.791 m). Physical Exam   Constitutional: He appears well-developed and well-nourished.  HENT:  Head: Normocephalic and atraumatic.  Eyes: Pupils are equal, round, and reactive to light. Conjunctivae and EOM are normal.    Exotropia   Neck: Normal range of motion.  Cardiovascular: Normal rate.  Respiratory: Effort normal.  GI: Soft.  Musculoskeletal: Normal range of motion.  Neurological: He is alert.  Skin: Skin is warm.     Assessment/Plan Exotropia ou c asthenopia and diplopia . Plan : Bilateral Lateral rectus recession : Bilateral Medial rectus resection c adjustable sutures os @ Left MR under general anesthesia.  [name], MD 07/08/2018, 4:35 PM

## 2018-07-10 NOTE — Anesthesia Preprocedure Evaluation (Addendum)
Anesthesia Evaluation  Patient identified by MRN, date of birth, ID band Patient awake    Reviewed: Allergy & Precautions, NPO status , Patient's Chart, lab work & pertinent test results  History of Anesthesia Complications Negative for: history of anesthetic complications (Hx of PONV after back surgery, multiple other anesthetics without PONV)  Airway Mallampati: II  TM Distance: >3 FB Neck ROM: Full    Dental  (+) Poor Dentition, Missing, Dental Advisory Given, Chipped, Upper Dentures,    Pulmonary asthma , Current Smoker,    Pulmonary exam normal breath sounds clear to auscultation       Cardiovascular hypertension, Pt. on medications Normal cardiovascular exam Rhythm:Regular Rate:Normal     Neuro/Psych negative neurological ROS     GI/Hepatic negative GI ROS, Neg liver ROS,   Endo/Other  negative endocrine ROS  Renal/GU negative Renal ROS     Musculoskeletal  (+) Arthritis , Osteoarthritis,    Abdominal   Peds  Hematology negative hematology ROS (+)   Anesthesia Other Findings Day of surgery medications reviewed with the patient.  Reproductive/Obstetrics                           Anesthesia Physical Anesthesia Plan  ASA: II  Anesthesia Plan: General   Post-op Pain Management:    Induction: Intravenous  PONV Risk Score and Plan: 1 and Treatment may vary due to age or medical condition, Dexamethasone and Ondansetron  Airway Management Planned: LMA  Additional Equipment:   Intra-op Plan:   Post-operative Plan: Extubation in OR  Informed Consent: I have reviewed the patients History and Physical, chart, labs and discussed the procedure including the risks, benefits and alternatives for the proposed anesthesia with the patient or authorized representative who has indicated his/her understanding and acceptance.   Dental advisory given  Plan Discussed with: CRNA  Anesthesia  Plan Comments:       Anesthesia Quick Evaluation

## 2018-07-11 ENCOUNTER — Ambulatory Visit (HOSPITAL_BASED_OUTPATIENT_CLINIC_OR_DEPARTMENT_OTHER): Payer: Medicare Other | Admitting: Anesthesiology

## 2018-07-11 ENCOUNTER — Ambulatory Visit (HOSPITAL_BASED_OUTPATIENT_CLINIC_OR_DEPARTMENT_OTHER)
Admission: RE | Admit: 2018-07-11 | Discharge: 2018-07-11 | Disposition: A | Discharge status: Home / Self Care | Payer: Medicare Other | Source: Ambulatory Visit | Attending: Ophthalmology | Admitting: Ophthalmology

## 2018-07-11 ENCOUNTER — Other Ambulatory Visit: Payer: Self-pay

## 2018-07-11 ENCOUNTER — Encounter (HOSPITAL_BASED_OUTPATIENT_CLINIC_OR_DEPARTMENT_OTHER): Payer: Self-pay | Admitting: Emergency Medicine

## 2018-07-11 ENCOUNTER — Encounter (HOSPITAL_BASED_OUTPATIENT_CLINIC_OR_DEPARTMENT_OTHER)
Admission: RE | Disposition: A | Discharge status: Home / Self Care | Payer: Self-pay | Source: Ambulatory Visit | Attending: Ophthalmology

## 2018-07-11 DIAGNOSIS — J45909 Unspecified asthma, uncomplicated: Secondary | ICD-10-CM | POA: Insufficient documentation

## 2018-07-11 DIAGNOSIS — M1990 Unspecified osteoarthritis, unspecified site: Secondary | ICD-10-CM | POA: Diagnosis not present

## 2018-07-11 DIAGNOSIS — F17210 Nicotine dependence, cigarettes, uncomplicated: Secondary | ICD-10-CM | POA: Diagnosis not present

## 2018-07-11 DIAGNOSIS — H532 Diplopia: Secondary | ICD-10-CM | POA: Insufficient documentation

## 2018-07-11 DIAGNOSIS — R7303 Prediabetes: Secondary | ICD-10-CM | POA: Insufficient documentation

## 2018-07-11 DIAGNOSIS — H50331 Intermittent monocular exotropia, right eye: Secondary | ICD-10-CM | POA: Insufficient documentation

## 2018-07-11 DIAGNOSIS — I10 Essential (primary) hypertension: Secondary | ICD-10-CM | POA: Insufficient documentation

## 2018-07-11 DIAGNOSIS — Z79899 Other long term (current) drug therapy: Secondary | ICD-10-CM | POA: Diagnosis not present

## 2018-07-11 DIAGNOSIS — E785 Hyperlipidemia, unspecified: Secondary | ICD-10-CM | POA: Diagnosis not present

## 2018-07-11 DIAGNOSIS — H50332 Intermittent monocular exotropia, left eye: Secondary | ICD-10-CM | POA: Insufficient documentation

## 2018-07-11 HISTORY — DX: Adverse effect of unspecified anesthetic, initial encounter: T41.45XA

## 2018-07-11 HISTORY — DX: Unspecified strabismus: H50.9

## 2018-07-11 HISTORY — DX: Other specified postprocedural states: R11.2

## 2018-07-11 HISTORY — PX: ADJUSTABLE SUTURE MANIPULATION: SHX5290

## 2018-07-11 HISTORY — PX: STRABISMUS SURGERY: SHX218

## 2018-07-11 HISTORY — DX: Hyperlipidemia, unspecified: E78.5

## 2018-07-11 HISTORY — DX: Other complications of anesthesia, initial encounter: T88.59XA

## 2018-07-11 HISTORY — DX: Other specified postprocedural states: Z98.890

## 2018-07-11 HISTORY — DX: Prediabetes: R73.03

## 2018-07-11 LAB — POCT I-STAT, CHEM 8
BUN: 7 mg/dL (ref 6–20)
CHLORIDE: 99 mmol/L (ref 98–111)
Calcium, Ion: 1.24 mmol/L (ref 1.15–1.40)
Creatinine, Ser: 1 mg/dL (ref 0.61–1.24)
Glucose, Bld: 100 mg/dL — ABNORMAL HIGH (ref 70–99)
HEMATOCRIT: 39 % (ref 39.0–52.0)
Hemoglobin: 13.3 g/dL (ref 13.0–17.0)
POTASSIUM: 3.7 mmol/L (ref 3.5–5.1)
SODIUM: 139 mmol/L (ref 135–145)
TCO2: 28 mmol/L (ref 22–32)

## 2018-07-11 SURGERY — REPAIR STRABISMUS
Anesthesia: General | Site: Eye | Laterality: Bilateral

## 2018-07-11 SURGERY — ADJUSTABLE SUTURE MANIPULATION
Anesthesia: General | Site: Eye | Laterality: Right

## 2018-07-11 MED ORDER — ONDANSETRON HCL 4 MG/2ML IJ SOLN
INTRAMUSCULAR | Status: AC
  Filled 2018-07-11: qty 2

## 2018-07-11 MED ORDER — LIDOCAINE 2% (20 MG/ML) 5 ML SYRINGE
INTRAMUSCULAR | Status: DC | PRN
  Administered 2018-07-11: 80 mg via INTRAVENOUS

## 2018-07-11 MED ORDER — DEXAMETHASONE SODIUM PHOSPHATE 10 MG/ML IJ SOLN
INTRAMUSCULAR | Status: AC
  Filled 2018-07-11: qty 1

## 2018-07-11 MED ORDER — TOBRAMYCIN-DEXAMETHASONE 0.3-0.1 % OP OINT
TOPICAL_OINTMENT | OPHTHALMIC | Status: DC | PRN
  Administered 2018-07-11: 1 via OPHTHALMIC

## 2018-07-11 MED ORDER — OXYCODONE HCL 5 MG PO TABS
5.0000 mg | ORAL_TABLET | Freq: Once | ORAL | Status: AC | PRN
  Administered 2018-07-11: 5 mg via ORAL
  Filled 2018-07-11: qty 1

## 2018-07-11 MED ORDER — FENTANYL CITRATE (PF) 100 MCG/2ML IJ SOLN
INTRAMUSCULAR | Status: DC | PRN
  Administered 2018-07-11: 50 ug via INTRAVENOUS
  Administered 2018-07-11 (×4): 25 ug via INTRAVENOUS

## 2018-07-11 MED ORDER — KETOROLAC TROMETHAMINE 30 MG/ML IJ SOLN
INTRAMUSCULAR | Status: DC | PRN
  Administered 2018-07-11: 30 mg via INTRAVENOUS

## 2018-07-11 MED ORDER — OXYCODONE HCL 5 MG PO TABS
ORAL_TABLET | ORAL | Status: AC
  Filled 2018-07-11: qty 1

## 2018-07-11 MED ORDER — ACETAMINOPHEN-CODEINE #3 300-30 MG PO TABS
1.0000 | ORAL_TABLET | Freq: Four times a day (QID) | ORAL | Status: DC | PRN
  Filled 2018-07-11: qty 2

## 2018-07-11 MED ORDER — PROPOFOL 10 MG/ML IV BOLUS
INTRAVENOUS | Status: AC
  Filled 2018-07-11: qty 20

## 2018-07-11 MED ORDER — PHENYLEPHRINE HCL 2.5 % OP SOLN
OPHTHALMIC | Status: DC | PRN
  Administered 2018-07-11: 3 [drp] via OPHTHALMIC

## 2018-07-11 MED ORDER — PROPOFOL 10 MG/ML IV BOLUS
INTRAVENOUS | Status: DC | PRN
  Administered 2018-07-11: 180 mg via INTRAVENOUS

## 2018-07-11 MED ORDER — ACETAMINOPHEN 10 MG/ML IV SOLN
1000.0000 mg | Freq: Once | INTRAVENOUS | Status: DC | PRN
  Filled 2018-07-11: qty 100

## 2018-07-11 MED ORDER — FENTANYL CITRATE (PF) 100 MCG/2ML IJ SOLN
INTRAMUSCULAR | Status: AC
  Filled 2018-07-11: qty 2

## 2018-07-11 MED ORDER — FENTANYL CITRATE (PF) 100 MCG/2ML IJ SOLN
25.0000 ug | INTRAMUSCULAR | Status: DC | PRN
  Administered 2018-07-11: 50 ug via INTRAVENOUS
  Filled 2018-07-11: qty 1

## 2018-07-11 MED ORDER — GLYCOPYRROLATE PF 0.2 MG/ML IJ SOSY
PREFILLED_SYRINGE | INTRAMUSCULAR | Status: AC
  Filled 2018-07-11: qty 1

## 2018-07-11 MED ORDER — LACTATED RINGERS IV SOLN
INTRAVENOUS | Status: DC
  Administered 2018-07-11 (×2): via INTRAVENOUS
  Filled 2018-07-11: qty 1000

## 2018-07-11 MED ORDER — TOBRAMYCIN-DEXAMETHASONE 0.3-0.1 % OP OINT
1.0000 "application " | TOPICAL_OINTMENT | Freq: Two times a day (BID) | OPHTHALMIC | Status: DC
  Filled 2018-07-11: qty 3.5

## 2018-07-11 MED ORDER — GLYCOPYRROLATE PF 0.2 MG/ML IJ SOSY
PREFILLED_SYRINGE | INTRAMUSCULAR | Status: DC | PRN
  Administered 2018-07-11: .2 mg via INTRAVENOUS

## 2018-07-11 MED ORDER — TETRACAINE HCL 0.5 % OP SOLN
OPHTHALMIC | Status: DC | PRN
  Administered 2018-07-11: 2 [drp] via OPHTHALMIC

## 2018-07-11 MED ORDER — OXYCODONE HCL 5 MG/5ML PO SOLN
5.0000 mg | Freq: Once | ORAL | Status: AC | PRN
  Filled 2018-07-11: qty 5

## 2018-07-11 MED ORDER — BSS IO SOLN
INTRAOCULAR | Status: DC | PRN
  Administered 2018-07-11: 15 mL via INTRAOCULAR

## 2018-07-11 MED ORDER — TOBRAMYCIN 0.3 % OP OINT
TOPICAL_OINTMENT | OPHTHALMIC | Status: DC | PRN
  Administered 2018-07-11: 1 via OPHTHALMIC

## 2018-07-11 MED ORDER — MIDAZOLAM HCL 2 MG/2ML IJ SOLN
INTRAMUSCULAR | Status: AC
  Filled 2018-07-11: qty 2

## 2018-07-11 MED ORDER — MIDAZOLAM HCL 2 MG/2ML IJ SOLN
INTRAMUSCULAR | Status: DC | PRN
  Administered 2018-07-11: 2 mg via INTRAVENOUS

## 2018-07-11 MED ORDER — ONDANSETRON HCL 4 MG/2ML IJ SOLN
INTRAMUSCULAR | Status: DC | PRN
  Administered 2018-07-11: 4 mg via INTRAVENOUS

## 2018-07-11 MED ORDER — KETOROLAC TROMETHAMINE 30 MG/ML IJ SOLN
INTRAMUSCULAR | Status: AC
  Filled 2018-07-11: qty 1

## 2018-07-11 MED ORDER — DEXAMETHASONE SODIUM PHOSPHATE 10 MG/ML IJ SOLN
INTRAMUSCULAR | Status: DC | PRN
  Administered 2018-07-11: 10 mg via INTRAVENOUS

## 2018-07-11 MED ORDER — POVIDONE-IODINE 5 % OP SOLN
OPHTHALMIC | Status: DC | PRN
  Administered 2018-07-11: 1 via OPHTHALMIC

## 2018-07-11 MED ORDER — PROMETHAZINE HCL 25 MG/ML IJ SOLN
6.2500 mg | INTRAMUSCULAR | Status: DC | PRN
  Filled 2018-07-11: qty 1

## 2018-07-11 SURGICAL SUPPLY — 27 items
APPLICATOR DR MATTHEWS STRL (MISCELLANEOUS) ×6 IMPLANT
BANDAGE EYE OVAL (MISCELLANEOUS) ×4 IMPLANT
BNDG EYE OVAL (GAUZE/BANDAGES/DRESSINGS) ×2 IMPLANT
CAUTERY EYE LOW TEMP 1300F FIN (OPHTHALMIC RELATED) ×2 IMPLANT
CORDS BIPOLAR (ELECTRODE) IMPLANT
COVER BACK TABLE 60X90IN (DRAPES) ×2 IMPLANT
COVER MAYO STAND STRL (DRAPES) ×2 IMPLANT
COVER WAND RF STERILE (DRAPES) IMPLANT
DRAPE SHEET LG 3/4 BI-LAMINATE (DRAPES) ×2 IMPLANT
DRAPE SURG 17X23 STRL (DRAPES) ×6 IMPLANT
GLOVE SURG SIGNA 7.5 PF LTX (GLOVE) ×2 IMPLANT
GOWN STRL REUS W/ TWL LRG LVL3 (GOWN DISPOSABLE) ×1 IMPLANT
GOWN STRL REUS W/TWL LRG LVL3 (GOWN DISPOSABLE) ×1
KIT TURNOVER CYSTO (KITS) ×2 IMPLANT
MANIFOLD NEPTUNE II (INSTRUMENTS) IMPLANT
NS IRRIG 500ML POUR BTL (IV SOLUTION) IMPLANT
PACK BASIN DAY SURGERY FS (CUSTOM PROCEDURE TRAY) ×2 IMPLANT
SPEAR EYE SURGICAL ST (MISCELLANEOUS) IMPLANT
STRIP CLOSURE SKIN 1/2X4 (GAUZE/BANDAGES/DRESSINGS) ×2 IMPLANT
SUT MERSILENE 6 0 S14 DA (SUTURE) IMPLANT
SUT VICRYL 6 0 S 29 12 (SUTURE) ×12 IMPLANT
SUT VICRYL 7 0 TG140 8 (SUTURE) IMPLANT
SUT VICRYL 8 0 TG140 8 (SUTURE) IMPLANT
TOWEL OR 17X24 6PK STRL BLUE (TOWEL DISPOSABLE) ×2 IMPLANT
TRAY DSU PREP LF (CUSTOM PROCEDURE TRAY) ×2 IMPLANT
TUBE CONNECTING 12X1/4 (SUCTIONS) IMPLANT
WATER STERILE IRR 500ML POUR (IV SOLUTION) ×4 IMPLANT

## 2018-07-11 SURGICAL SUPPLY — 17 items
APPLICATOR DR MATTHEWS STRL (MISCELLANEOUS) ×2 IMPLANT
CONT SPECI 4OZ STER CLIK (MISCELLANEOUS) ×2 IMPLANT
COVER MAYO STAND STRL (DRAPES) ×2 IMPLANT
GLOVE LITE  25/BX (GLOVE) ×2 IMPLANT
GLOVE SURG SIGNA 7.5 PF LTX (GLOVE) ×2 IMPLANT
KIT TURNOVER CYSTO (KITS) ×2 IMPLANT
MANIFOLD NEPTUNE II (INSTRUMENTS) IMPLANT
MARKER SKIN DUAL TIP RULER LAB (MISCELLANEOUS) ×2 IMPLANT
NS IRRIG 500ML POUR BTL (IV SOLUTION) IMPLANT
PACK ICE SM (MISCELLANEOUS) IMPLANT
PAD ALCOHOL SWAB (MISCELLANEOUS) ×2 IMPLANT
SUT VICRYL 8 0 TG140 8 (SUTURE) IMPLANT
SYR 3ML 23GX1 SAFETY (SYRINGE) ×2 IMPLANT
TAPE SURG TRANSPORE 1 IN (GAUZE/BANDAGES/DRESSINGS) ×1 IMPLANT
TAPE SURGICAL TRANSPORE 1 IN (GAUZE/BANDAGES/DRESSINGS) ×1
TOWEL OR 17X24 6PK STRL BLUE (TOWEL DISPOSABLE) ×2 IMPLANT
TUBE CONNECTING 12X1/4 (SUCTIONS) IMPLANT

## 2018-07-11 NOTE — Transfer of Care (Signed)
Immediate Anesthesia Transfer of Care Note  Patient: Carl Orozco  Procedure(s) Performed: Procedure(s) (LRB): REPAIR STRABISMUS (Bilateral)  Patient Location: PACU  Anesthesia Type: General  Level of Consciousness: awake, oriented, sedated and patient cooperative  Airway & Oxygen Therapy: Patient Spontanous Breathing and Patient connected to face mask oxygen  Post-op Assessment: Report given to PACU RN and Post -op Vital signs reviewed and stable  Post vital signs: Reviewed and stable  Complications: No apparent anesthesia complications  Last Vitals:  Vitals Value Taken Time  BP    Temp    Pulse 92 07/11/2018 12:57 PM  Resp 22 07/11/2018 12:57 PM  SpO2 98 % 07/11/2018 12:57 PM  Vitals shown include unvalidated device data.  Last Pain:  Vitals:   07/11/18 0828  TempSrc: Oral  PainSc: 7       Patients Stated Pain Goal: 5 (07/11/18 [id number])

## 2018-07-11 NOTE — Interval H&P Note (Signed)
History and Physical Interval Note:  07/11/2018 10:41 AM  Carl Orozco  has presented today for surgery, with the diagnosis of EXOTROPIA  The various methods of treatment have been discussed with the patient and family. After consideration of risks, benefits and other options for treatment, the patient has consented to  Procedure(s): REPAIR STRABISMUS (Bilateral) as a surgical intervention .  The patient's history has been reviewed, patient examined, no change in status, stable for surgery.  I have reviewed the patient's chart and labs.  Questions were answered to the patient's satisfaction.     Aura CampsMichael Sony Schlarb

## 2018-07-11 NOTE — Anesthesia Procedure Notes (Signed)
Procedure Name: LMA Insertion Date/Time: 07/11/2018 10:51 AM Performed by: Francie MassingHazel, Attila Mccarthy D, CRNA Pre-anesthesia Checklist: Patient identified, Emergency Drugs available, Suction available and Patient being monitored Patient Re-evaluated:Patient Re-evaluated prior to induction Oxygen Delivery Method: Circle system utilized Preoxygenation: Pre-oxygenation with 100% oxygen Induction Type: IV induction Ventilation: Mask ventilation without difficulty LMA: LMA flexible inserted LMA Size: 4.0 Number of attempts: 1 Airway Equipment and Method: Bite block Placement Confirmation: positive ETCO2 Tube secured with: Tape Dental Injury: Teeth and Oropharynx as per pre-operative assessment

## 2018-07-11 NOTE — Progress Notes (Signed)
[place] [employer] taken back to OR for suture adjustment.

## 2018-07-11 NOTE — Brief Op Note (Signed)
07/11/2018  12:59 PM  PATIENT:  Carl Orozco  57 y.o. male  PRE-OPERATIVE DIAGNOSIS:  EXOTROPIA  POST-OPERATIVE DIAGNOSIS:  EXOTROPIA  PROCEDURE:  Procedure(s): REPAIR STRABISMUS (Bilateral)  SURGEON:  Surgeon(s) and Role:    [name]* Myra Weng, MD - Primary  PHYSICIAN ASSISTANT:   ASSISTANTS: none   ANESTHESIA:   general  EBL:  2 mL   BLOOD ADMINISTERED:none  DRAINS: none   LOCAL MEDICATIONS USED:  NONE  SPECIMEN:  No Specimen  DISPOSITION OF SPECIMEN:  N/A  COUNTS:  YES  TOURNIQUET:  * No tourniquets in log *  DICTATION: .Other Dictation: Dictation Number [id number]  PLAN OF CARE: Discharge to home after PACU  PATIENT DISPOSITION:  PACU - hemodynamically stable.   Delay start of Pharmacological VTE agent (>24hrs) due to surgical blood loss or risk of bleeding: yes

## 2018-07-11 NOTE — Discharge Instructions (Signed)
D/C  IVF when PO intake adequate. Cool compresses ou Q 15 mins as tolerated . D/C to home when VSS. [facility] Use I application of tobradex ointment to both eyes 2x/day.  Use 1/4 strip Tylenol #3 1-2 tabs po every 6 hrs prn moderate pain  NO ADVIL, ALEVE, MOTRIN, IBUPROFEN UNTIL 6 PM   Call your surgeon if you experience:   1.  Fever over 101.0. 2.  Inability to urinate. 3.  Nausea and/or vomiting. 4.  Extreme swelling or bruising at the surgical site. 5.  Continued bleeding from the incision. 6.  Increased pain, redness or drainage from the incision. 7.  Problems related to your pain medication. 8.  Any problems and/or concerns   Post Anesthesia Home Care Instructions  Activity: Get plenty of rest for the remainder of the day. A responsible adult should stay with you for 24 hours following the procedure.  For the next 24 hours, DO NOT: -Drive a car -Advertising copywriterperate machinery -Drink alcoholic beverages -Take any medication unless instructed by your physician -Make any legal decisions or sign important papers.  Meals: Start with liquid foods such as gelatin or soup. Progress to regular foods as tolerated. Avoid greasy, spicy, heavy foods. If nausea and/or vomiting occur, drink only clear liquids until the nausea and/or vomiting subsides. Call your physician if vomiting continues.  Special Instructions/Symptoms: Your throat may feel dry or sore from the anesthesia or the breathing tube placed in your throat during surgery. If this causes discomfort, gargle with warm salt water. The discomfort should disappear within 24 hours.  If you had a scopolamine patch placed behind your ear for the management of post- operative nausea and/or vomiting:  1. The medication in the patch is effective for 72 hours, after which it should be removed.  Wrap patch in a tissue and discard in the trash. Wash hands thoroughly with soap and water. 2. You may remove the patch earlier than  72 hours if you experience unpleasant side effects which may include dry mouth, dizziness or visual disturbances. 3. Avoid touching the patch. Wash your hands with soap and water after contact with the patch.

## 2018-07-11 NOTE — Anesthesia Postprocedure Evaluation (Signed)
Anesthesia Post Note  Patient: Carl Orozco  Procedure(s) Performed: REPAIR STRABISMUS (Bilateral Eye)     Patient location during evaluation: PACU Anesthesia Type: General Level of consciousness: awake and alert Pain management: pain level controlled Vital Signs Assessment: post-procedure vital signs reviewed and stable Respiratory status: spontaneous breathing, nonlabored ventilation and respiratory function stable Cardiovascular status: blood pressure returned to baseline and stable Postop Assessment: no apparent nausea or vomiting Anesthetic complications: no    Last Vitals:  Vitals:   07/11/18 1310 07/11/18 1315  BP:    Pulse: 87 86  Resp: (!) 22 (!) 22  Temp:    SpO2: 99% 98%    Last Pain:  Vitals:   07/11/18 1310  TempSrc:   PainSc: (P) 8                  Kaylyn LayerKathryn E Marguis Mathieson

## 2018-07-12 ENCOUNTER — Encounter (HOSPITAL_BASED_OUTPATIENT_CLINIC_OR_DEPARTMENT_OTHER): Payer: Self-pay | Admitting: Ophthalmology

## 2018-07-12 NOTE — Op Note (Signed)
Carl Orozco: Icenogle, Montrez MEDICAL RECORD YN:[id number] ACCOUNT [id number] DATE OF BIRTH:1961-04-18 FACILITY: [facility] LOCATION: [facility] PHYSICIAN:Sayaka Hoeppner Scotty CourtA. Nile Dorning, MD  OPERATIVE REPORT  DATE OF PROCEDURE:  07/11/2018  PREOPERATIVE DIAGNOSIS:  Decompensated intermittent exotropia with intermittent diplopia.  PROCEDURE: 1.  Bilateral medial rectus resection of 4 mm with left medial rectus resection placed on adjustable suture.  2.  Bilateral lateral rectus recessions of 5 mm.  SURGEON:  Aura CampsMichael Margaux Engen, MD  ANESTHESIA:  General with laryngeal mask airway.  POSTOPERATIVE DIAGNOSIS:  Status post repair of strabismus, both eyes.  INDICATIONS FOR PROCEDURE:  The patient is a 57 year old male with chronic intermittent exotropia, which has decompensated to diplopia and exotropia.  This procedure is indicated to restore single binocular vision and restore alignment of visual axis.   The risks and benefits of the procedure were explained to the patient prior to procedure.  Informed consent was obtained.  TECHNIQUE:  The patient was taken into the operating room and placed in the supine position.  The entire face was prepped and draped in the usual sterile fashion after induction by general anesthesia and establishment of laryngeal mask airway.  My attention was first directed to the right eye.  A lid speculum was placed.  Forced duction tests were performed and found to be negative.  The globe was then held in the inferior nasal quadrant.  The eye was then elevated and abducted.  An incision was  made through the inferior nasal fornix and taken down to the posterior sub-Tenons space.  The right medial rectus tendon was then isolated on a Stevens hook, subsequently on a green hook.  A second Green hook was then passed beneath the tendon, and this was used to hold the globe in elevated and abducted position.  Next, the tendon was then dissected free from the overlying muscle fascia and intermuscular  septum for a distance of approximately 5 mm.  A mark was then placed on the tendon.  At 3 mm from  its insertion, the tendon was then imbricated on 6-0 Vicryl suture, taking 2 locking bites into the medial and temporal apices at the preplaced mark.  The tendon was then advanced to a point 1 mm anterior to its native insertion site and reattached to the globe at this position using the preplaced sutures, thereby completing a 4 mm resection.    Next, my attention was then directed to the left eye where a lid speculum was placed.  Forced duction tests were performed and found to be negative.  The globe was then held in inferior temporal quadrant.  The eye was elevated and abducted.  An incision was then made through the inferior temporal fornix and taken down to the posterior sub-Tenon space, and the left lateral rectus tendon was then isolated on a Stevens hook, subsequently on a green hook.  A second Green hook was then passed beneath the  tendon.  This was used to hold the globe in an elevated and abducted position.  Next, the tendon was then carefully dissected free from its overlying muscle fascia and intermuscular septae were transected.  The tendon was then imbricated on 6-0 Vicryl  suture, taking 2 locking bites at the medial and temporal apices.  It was then dissected free from the globe and recessed exactly 5 mm from its native insertion.  It was reattached to the globe using preplaced sutures, and the sutures were then tied securely.  The conjunctiva was repositioned.  My attention was then directed to the left medial  rectus tendon.  The globe was held in the inferior nasal quadrant.  The eye was elevated and abducted.  An incision was made through the inferior nasal fornix  and taken down to the posterior sub-Tenons space.  The left medial rectus tendon was then isolated on a Stevens hook, subsequently on a green hook.  The tendon was then dissected free from its overlying muscle fascia and  intermuscular septae for a distance of 5 mm.  Marks were then placed on the tendon at 4 mm from its insertion point.  The tendon was then imbricated on 6-0 Vicryl suture, taking 2 locking bites of medial and temporal apices of the preplaced mark.  The tendon was then advanced to a  1 mm anterior to its insertion site on the preplaced sutures and reattached to the globe in an adjustable suture fashion.  The sutures were then reposited under the conjunctiva.My attention was then redirected to the right eye.   The globe was held in inferior temporal quadrant,  the eye was elevated and adducted.  An incision was made through the inferior temporal fornix and taken down to the posterior sub-Tenons space.  The right lateral rectus tendon was then isolated on a Stevens hook, subsequently on a green hook.  The tendon was imbricated on 6-0 Vicryl suture, taking 2 locking bites at the medial and temporal apices.  The tendon was then dissected free from the globe and recessed exactly 5 mm from its native insertion.   It was reattached to the globe using preplaced sutures.  The suture was tied securely and the conjunctiva repositioned.  At the conclusion of the procedure,a double pressure patch was applied to the left eye, and the patient was then allowed to awaken  from anesthesia for approximately 30 minutes.  He was then returned to the operative suite where the the sutures were adjusted.  The patient was adjusted to orthophoria, and the medial rectus adjustable suture in the left eye was then tied securely.  The conjunctiva was repositioned over the sutures, and TobraDex ointment was instilled in inferior fornices of both eyes.  There were no apparent complications.  LN/NUANCE  D:07/11/2018 T:07/12/2018 JOB:[id number]

## 2019-01-13 ENCOUNTER — Encounter (HOSPITAL_COMMUNITY): Payer: Self-pay

## 2019-01-13 ENCOUNTER — Emergency Department (HOSPITAL_COMMUNITY)
Admission: EM | Admit: 2019-01-13 | Discharge: 2019-01-13 | Disposition: A | Discharge status: Home / Self Care | Payer: Medicare Other | Attending: Emergency Medicine | Admitting: Emergency Medicine

## 2019-01-13 ENCOUNTER — Other Ambulatory Visit: Payer: Self-pay

## 2019-01-13 ENCOUNTER — Emergency Department (HOSPITAL_COMMUNITY): Payer: Medicare Other

## 2019-01-13 DIAGNOSIS — Z20828 Contact with and (suspected) exposure to other viral communicable diseases: Secondary | ICD-10-CM | POA: Diagnosis not present

## 2019-01-13 DIAGNOSIS — F17210 Nicotine dependence, cigarettes, uncomplicated: Secondary | ICD-10-CM | POA: Insufficient documentation

## 2019-01-13 DIAGNOSIS — Z79899 Other long term (current) drug therapy: Secondary | ICD-10-CM | POA: Diagnosis not present

## 2019-01-13 DIAGNOSIS — I10 Essential (primary) hypertension: Secondary | ICD-10-CM | POA: Diagnosis not present

## 2019-01-13 DIAGNOSIS — R0602 Shortness of breath: Secondary | ICD-10-CM

## 2019-01-13 DIAGNOSIS — J45909 Unspecified asthma, uncomplicated: Secondary | ICD-10-CM | POA: Insufficient documentation

## 2019-01-13 LAB — CBC WITH DIFFERENTIAL/PLATELET
Abs Immature Granulocytes: 0.01 10*3/uL (ref 0.00–0.07)
Basophils Absolute: 0.1 10*3/uL (ref 0.0–0.1)
Basophils Relative: 1 %
Eosinophils Absolute: 0.1 10*3/uL (ref 0.0–0.5)
Eosinophils Relative: 1 %
HCT: 40.9 % (ref 39.0–52.0)
Hemoglobin: 14 g/dL (ref 13.0–17.0)
Immature Granulocytes: 0 %
Lymphocytes Relative: 33 %
Lymphs Abs: 3.2 10*3/uL (ref 0.7–4.0)
MCH: 30.3 pg (ref 26.0–34.0)
MCHC: 34.2 g/dL (ref 30.0–36.0)
MCV: 88.5 fL (ref 80.0–100.0)
Monocytes Absolute: 0.8 10*3/uL (ref 0.1–1.0)
Monocytes Relative: 8 %
Neutro Abs: 5.5 10*3/uL (ref 1.7–7.7)
Neutrophils Relative %: 57 %
Platelets: 262 10*3/uL (ref 150–400)
RBC: 4.62 MIL/uL (ref 4.22–5.81)
RDW: 13.4 % (ref 11.5–15.5)
WBC: 9.7 10*3/uL (ref 4.0–10.5)
nRBC: 0 % (ref 0.0–0.2)

## 2019-01-13 LAB — BRAIN NATRIURETIC PEPTIDE: B Natriuretic Peptide: 19.7 pg/mL (ref 0.0–100.0)

## 2019-01-13 LAB — COMPREHENSIVE METABOLIC PANEL
ALT: 15 U/L (ref 0–44)
AST: 27 U/L (ref 15–41)
Albumin: 4.5 g/dL (ref 3.5–5.0)
Alkaline Phosphatase: 43 U/L (ref 38–126)
Anion gap: 9 (ref 5–15)
BUN: 9 mg/dL (ref 6–20)
CO2: 25 mmol/L (ref 22–32)
Calcium: 9.1 mg/dL (ref 8.9–10.3)
Chloride: 103 mmol/L (ref 98–111)
Creatinine, Ser: 0.94 mg/dL (ref 0.61–1.24)
GFR calc Af Amer: >60 mL/min (ref >60)
GFR calc non Af Amer: >60 mL/min (ref >60)
Glucose, Bld: 97 mg/dL (ref 70–99)
Potassium: 4 mmol/L (ref 3.5–5.1)
Sodium: 137 mmol/L (ref 135–145)
Total Bilirubin: 1.1 mg/dL (ref 0.3–1.2)
Total Protein: 8.2 g/dL — ABNORMAL HIGH (ref 6.5–8.1)

## 2019-01-13 LAB — SARS CORONAVIRUS 2 BY RT PCR (HOSPITAL ORDER, PERFORMED IN ~~LOC~~ HOSPITAL LAB): SARS Coronavirus 2: NEGATIVE

## 2019-01-13 MED ORDER — ALBUTEROL SULFATE HFA 108 (90 BASE) MCG/ACT IN AERS
2.0000 | INHALATION_SPRAY | Freq: Four times a day (QID) | RESPIRATORY_TRACT | Status: DC
  Administered 2019-01-13: 2 via RESPIRATORY_TRACT
  Filled 2019-01-13: qty 6.7

## 2019-01-13 MED ORDER — PREDNISONE 20 MG PO TABS
60.0000 mg | ORAL_TABLET | ORAL | Status: AC
  Administered 2019-01-13: 60 mg via ORAL
  Filled 2019-01-13: qty 3

## 2019-01-13 MED ORDER — PREDNISONE 20 MG PO TABS: 40.0000 mg | ORAL_TABLET | Freq: Every day | ORAL | 0 refills | Status: DC

## 2019-01-13 NOTE — ED Provider Notes (Signed)
[facility]-EMERGENCY DEPT Provider Note   CSN: [id number] Arrival date & time: 01/13/19  1443    History   Chief Complaint Chief Complaint  Patient presents with  . Shortness of Breath    for 1 week    HPI Carl Orozco is a 58 y.o. male.     HPI Patient presents with dyspnea. Onset was about 2 weeks ago. During this patient has had persistent dyspnea, worse with activity, exertion, not present at rest. No clear precipitant. Patient does have a history of reactive airway disease, has been using albuterol, which seems to improve his condition transiently. No new pain, no nausea, vomiting, fever. Patient has a history of chronic back pain for which he takes Suboxone, gabapentin. He notes that this is currently more active than usual. Past Medical History:  Diagnosis Date  . Arthritis   . Asthma   . Complication of anesthesia   . Eczema   . 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 Hyperlipidemia   . Hypertension   . PONV (postoperative nausea and vomiting)   . Pre-diabetes   . Strabismus     Patient Active Problem List   Diagnosis Date Noted  . Right inguinal hernia 09/02/2013  . Pure hypercholesterolemia 05/22/2013  . HYPERTENSION 06/29/2007  . SINUSITIS 06/29/2007  . LOW BACK PAIN, CHRONIC 06/29/2007    Past Surgical History:  Procedure Laterality Date  . ADJUSTABLE SUTURE MANIPULATION Right 07/11/2018   Procedure: ADJUSTABLE SUTURE MANIPULATION;  Surgeon: Aura CampsSpencer, Michael, MD;  [facility];  Service: Ophthalmology;  Laterality: Right;  . INGUINAL HERNIA REPAIR Right 09/17/2013   Procedure: OPEN REPAIR RIGHT INGUINAL HERNIA WITH MESH;  Surgeon: Ernestene MentionHaywood M Ingram, MD;  Location: [facility];  Service: General;  Laterality: Right;  . INSERTION OF MESH Right 09/17/2013   Procedure: INSERTION OF MESH;  Surgeon: Ernestene MentionHaywood M Ingram, MD;  Location: [facility];  Service:  General;  Laterality: Right;  . LUMBAR DISC SURGERY  05-20-2008   dr Alveda Reasonstooke @[facility]    L4-5, L5-S1  . STRABISMUS SURGERY Bilateral 07/11/2018   Procedure: REPAIR STRABISMUS;  Surgeon: Aura CampsSpencer, Michael, MD;  [facility];  Service: Ophthalmology;  Laterality: Bilateral;        Home Medications    Prior to Admission medications   Medication Sig Start Date End Date Taking? Authorizing Provider  albuterol (PROVENTIL HFA;VENTOLIN HFA) 108 (90 BASE) MCG/ACT inhaler Inhale 2 puffs into the lungs every 6 (six) hours as needed for wheezing or shortness of breath. For shortness of breath Patient not taking: Reported on 01/13/2019 05/22/13   Eulis FosterWebb, Padonda B, FNP  baclofen (LIORESAL) 10 MG tablet Take 10 mg by mouth daily as needed for muscle spasms. 11/07/16   [provider]  ezetimibe (ZETIA) 10 MG tablet Take 10 mg by mouth at bedtime.     [provider]  fluticasone (FLONASE) 50 MCG/ACT nasal spray Place 2 sprays into the nose daily as needed for rhinitis or allergies. Patient not taking: Reported on 01/13/2019 05/22/13   Eulis FosterWebb, Padonda B, FNP  simvastatin (ZOCOR) 10 MG tablet Take 1 tablet (10 mg total) by mouth at bedtime. Patient not taking: Reported on 01/13/2019 05/22/13   Worthy RancherWebb, Padonda B, FNP  triamcinolone cream (KENALOG) 0.1 % Apply 1 application topically 2 (two) times daily as needed. 01/06/17   [provider]  valsartan-hydrochlorothiazide (DIOVAN-HCT) 320-25 MG per tablet Take 1 tablet by mouth  daily with breakfast.  05/29/13   Eulis Foster, FNP    Family History Family History  Problem Relation Age of Onset  . Hypertension Mother     Social History Social History   Tobacco Use  . Smoking status: Current Some Day Smoker    Packs/day: 0.50    Years: 10.00    Pack years: 5.00    Types: Cigarettes  . Smokeless tobacco: Never Used  . Tobacco comment: occasional  Substance Use Topics  . Alcohol use: No  . Drug use: Never      Allergies   Aspirin and Fish allergy   Review of Systems Review of Systems  Constitutional:       Per HPI, otherwise negative  HENT:       Per HPI, otherwise negative  Respiratory:       Per HPI, otherwise negative  Cardiovascular:       Per HPI, otherwise negative  Gastrointestinal: Negative for vomiting.  Endocrine:       Negative aside from HPI  Genitourinary:       Neg aside from HPI   Musculoskeletal:       Per HPI, otherwise negative  Skin: Negative.   Neurological: Negative for syncope.     Physical Exam Updated Vital Signs BP 129/83   Pulse (!) 59   Temp 98.1 F (36.7 C) (Oral)   Resp 18   Ht  (1.778 m)   Wt 106.6 kg   SpO2 100%   BMI 33.72 kg/m   Physical Exam Vitals signs and nursing note reviewed.  Constitutional:      General: He is not in acute distress.    Appearance: He is well-developed.  HENT:     Head: Normocephalic and atraumatic.  Eyes:     Conjunctiva/sclera: Conjunctivae normal.  Cardiovascular:     Rate and Rhythm: Normal rate and regular rhythm.  Pulmonary:     Effort: Pulmonary effort is normal. No respiratory distress.     Breath sounds: No stridor.  Abdominal:     General: There is no distension.  Skin:    General: Skin is warm and dry.  Neurological:     Mental Status: He is alert and oriented to person, place, and time.      ED Treatments / Results  Labs (all labs ordered are listed, but only abnormal results are displayed) Labs Reviewed  COMPREHENSIVE METABOLIC PANEL - Abnormal; Notable for the following components:      Result Value   Total Protein 8.2 (*)    All other components within normal limits  SARS CORONAVIRUS 2 (HOSPITAL ORDER, PERFORMED IN [facility])  BRAIN NATRIURETIC PEPTIDE  CBC WITH DIFFERENTIAL/PLATELET    EKG None  Radiology Dg Chest Port 1 View  Result Date: 01/13/2019 CLINICAL DATA:  One-week history of shortness of breath and myalgias. EXAM: PORTABLE CHEST 1 VIEW  COMPARISON:  05/05/2018 and earlier. FINDINGS: Cardiac silhouette and mediastinal contours normal in appearance for the AP portable technique. Pulmonary parenchyma clear. Bronchovascular markings normal. Pulmonary vascularity normal. No pneumothorax. No visible pleural effusions. IMPRESSION: No acute cardiopulmonary disease. Electronically Signed   By: Hulan Saas M.D.   On: 01/13/2019 16:14    Procedures Procedures (including critical care time)  Medications Ordered in ED Medications  predniSONE (DELTASONE) tablet 60 mg (has no administration in time range)  albuterol (VENTOLIN HFA) 108 (90 Base) MCG/ACT inhaler 2 puff (has no administration in time range)     Initial  Impression / Assessment and Plan / ED Course  I have reviewed the triage vital signs and the nursing notes.  Pertinent labs & imaging results that were available during my care of the patient were reviewed by me and considered in my medical decision making (see chart for details).        5:10 PM Patient in no distress, sitting upright, watching television.  We discussed all findings including reassuring x-ray, negative coronavirus result With no hypoxia, reassuring labs, x-ray, low suspicion for occult pneumonia or other acute new pathology. Some suspicion for reactive airway disease given the patient's history, diminished breath sounds. Patient will start prednisone, albuterol. He has a physician with whom we will follow-up in the coming days.  Final Clinical Impressions(s) / ED Diagnoses  Shortness of breath   Gerhard Munch, MD 01/13/19 1711

## 2019-01-13 NOTE — ED Triage Notes (Signed)
Pt reports having SHOB and body aches for about one week. Denies any fever, cough, n/v/d or abd pain.

## 2019-01-13 NOTE — ED Notes (Signed)
Bed: ZJ67 Expected date:  Expected time:  Means of arrival:  Comments: [place] Covid+

## 2019-01-13 NOTE — Discharge Instructions (Signed)
As discussed, today's evaluation has been largely reassuring. There is currently no evidence for pneumonia, nor coronavirus. However, if you develop new, or concerning changes be sure to return here. Otherwise, please use your albuterol every 4 hours for the next 2 days, then as needed. In addition, take the prescribed steroids for the next 4 days as well.

## 2019-01-22 ENCOUNTER — Encounter (HOSPITAL_COMMUNITY): Payer: Self-pay

## 2019-01-22 ENCOUNTER — Other Ambulatory Visit: Payer: Self-pay

## 2019-01-22 ENCOUNTER — Emergency Department (HOSPITAL_COMMUNITY)
Admission: EM | Admit: 2019-01-22 | Discharge: 2019-01-22 | Disposition: A | Discharge status: Home / Self Care | Payer: Medicare Other | Attending: Emergency Medicine | Admitting: Emergency Medicine

## 2019-01-22 DIAGNOSIS — I10 Essential (primary) hypertension: Secondary | ICD-10-CM | POA: Insufficient documentation

## 2019-01-22 DIAGNOSIS — K029 Dental caries, unspecified: Secondary | ICD-10-CM | POA: Diagnosis not present

## 2019-01-22 DIAGNOSIS — K0889 Other specified disorders of teeth and supporting structures: Secondary | ICD-10-CM

## 2019-01-22 DIAGNOSIS — J45909 Unspecified asthma, uncomplicated: Secondary | ICD-10-CM | POA: Diagnosis not present

## 2019-01-22 DIAGNOSIS — Z79899 Other long term (current) drug therapy: Secondary | ICD-10-CM | POA: Insufficient documentation

## 2019-01-22 DIAGNOSIS — F17210 Nicotine dependence, cigarettes, uncomplicated: Secondary | ICD-10-CM | POA: Diagnosis not present

## 2019-01-22 MED ORDER — HYDROCODONE-ACETAMINOPHEN 5-325 MG PO TABS: 1.0000 | ORAL_TABLET | ORAL | 0 refills | Status: DC | PRN

## 2019-01-22 NOTE — ED Provider Notes (Signed)
[facility]-EMERGENCY DEPT Provider Note   CSN: [id number] Arrival date & time: 01/22/19  2010    History   Chief Complaint No chief complaint on file.   HPI Quatez Hilt is a 58 y.o. male.  Presents the emergency department chief complaint of dental pain.  Patienthave all of his bottom teeth pulled however unable to do so during the corn of pandemic.  Patient states that he was eating tonight and a tooth broke off and he is in severe pain.  Review of the PMP shows that the patient is currently on Suboxone.  I discussed that with the patient to is obviously in severe pain.  He is not under contract he.  He is currently trying to wean himself off of of oxycodone which is also visible in review of the drug database.  Patient denies difficulty swallowing fever or chills     HPI  Past Medical History:  Diagnosis Date  . Arthritis   . Asthma   . Complication of anesthesia   . Eczema   . 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 Hyperlipidemia   . Hypertension   . PONV (postoperative nausea and vomiting)   . Pre-diabetes   . Strabismus     Patient Active Problem List   Diagnosis Date Noted  . Right inguinal hernia 09/02/2013  . Pure hypercholesterolemia 05/22/2013  . HYPERTENSION 06/29/2007  . SINUSITIS 06/29/2007  . LOW BACK PAIN, CHRONIC 06/29/2007    Past Surgical History:  Procedure Laterality Date  . ADJUSTABLE SUTURE MANIPULATION Right 07/11/2018   Procedure: ADJUSTABLE SUTURE MANIPULATION;  Surgeon: [name], MD;  Location: [facility];  Service: Ophthalmology;  Laterality: Right;  . INGUINAL HERNIA REPAIR Right 09/17/2013   Procedure: OPEN REPAIR RIGHT INGUINAL HERNIA WITH MESH;  Surgeon: Ernestene Mention, MD;  Location: [facility];  Service: General;  Laterality: Right;  . INSERTION OF MESH Right 09/17/2013   Procedure: INSERTION OF MESH;  Surgeon: Ernestene Mention, MD;  Location: [facility];  Service: General;  Laterality: Right;  . LUMBAR DISC SURGERY  05-20-2008   dr Alveda Reasons @[facility]    L4-5, L5-S1  . STRABISMUS SURGERY Bilateral 07/11/2018   Procedure: REPAIR STRABISMUS;  Surgeon: [name], MD;  Location: [facility];  Service: Ophthalmology;  Laterality: Bilateral;        Home Medications    Prior to Admission medications   Medication Sig Start Date End Date Taking? Authorizing Provider  albuterol (PROVENTIL HFA;VENTOLIN HFA) 108 (90 BASE) MCG/ACT inhaler Inhale 2 puffs into the lungs every 6 (six) hours as needed for wheezing or shortness of breath. For shortness of breath Patient not taking: Reported on 01/13/2019 05/22/13   Eulis Foster, FNP  baclofen (LIORESAL) 10 MG tablet Take 10 mg by mouth daily as needed for muscle spasms. 11/07/16   [provider]  ezetimibe (ZETIA) 10 MG tablet Take 10 mg by mouth at bedtime.     [provider]  fluticasone (FLONASE) 50 MCG/ACT nasal spray Place 2 sprays into the nose daily as needed for rhinitis or allergies. Patient not taking: Reported on 01/13/2019 05/22/13   Eulis Foster, FNP  predniSONE (DELTASONE) 20 MG tablet Take 2 tablets (40 mg total) by mouth daily with breakfast. For the next four days 01/13/19   Gerhard Munch, MD  simvastatin (ZOCOR) 10 MG tablet Take 1 tablet (10 mg total) by mouth at bedtime.  Patient not taking: Reported on 01/13/2019 05/22/13   Worthy RancherWebb, Padonda B, FNP  triamcinolone cream (KENALOG) 0.1 % Apply 1 application topically 2 (two) times daily as needed. 01/06/17   [provider]  valsartan-hydrochlorothiazide (DIOVAN-HCT) 320-25 MG per tablet Take 1 tablet by mouth daily with breakfast.  05/29/13   Eulis FosterWebb, Padonda B, FNP    Family History Family History  Problem Relation Age of Onset  . Hypertension Mother     Social History Social History   Tobacco Use  . Smoking status: Current Some Day Smoker    Packs/day: 0.50     Years: 10.00    Pack years: 5.00    Types: Cigarettes  . Smokeless tobacco: Never Used  . Tobacco comment: occasional  Substance Use Topics  . Alcohol use: No  . Drug use: Never     Allergies   Aspirin and Fish allergy   Review of Systems Review of Systems  Ten systems reviewed and are negative for acute change, except as noted in the HPI.   Physical Exam Updated Vital Signs BP (!) 138/99   Pulse 69   Temp 98.2 F (36.8 C)   Resp 18   SpO2 100%   Physical Exam Physical Exam  Nursing note and vitals reviewed. Constitutional: He appears well-developed and well-nourished. No distress.  HENT:  Head: Normocephalic and atraumatic.  Mouth: Poor dentition throughout.  Multiple dental caries.  The right lower lateral incisor is fractured.  There is significant erythema of the gingiva without frank abscess. Eyes: Conjunctivae normal are normal. No scleral icterus.  Neck: Normal range of motion. Neck supple.  Cardiovascular: Normal rate, regular rhythm and normal heart sounds.   Pulmonary/Chest: Effort normal and breath sounds normal. No respiratory distress.  Abdominal: Soft. There is no tenderness.  Musculoskeletal: He exhibits no edema.  Neurological: He is alert.  Skin: Skin is warm and dry. He is not diaphoretic.  Psychiatric: His behavior is normal.     ED Treatments / Results  Labs (all labs ordered are listed, but only abnormal results are displayed) Labs Reviewed - No data to display  EKG None  Radiology No results found.  Procedures Procedures (including critical care time)  Medications Ordered in ED Medications - No data to display   Initial Impression / Assessment and Plan / ED Course  I have reviewed the triage vital signs and the nursing notes.  Pertinent labs & imaging results that were available during my care of the patient were reviewed by me and considered in my medical decision making (see chart for details).        PDMP reviewed  during this encounter.  Although the patient is currently taking Suboxone, feel that the current situation warrants pain control.  I also got a phone call from [facility] regarding the prescription and assured them that I had reviewed the drug database.  Patient was given 6 tablets of Norco for pain control.  He is currently taking clindamycin as prescribed by his dentist.  He is advised to follow closely with his dentist.  He was appropriate for discharge at this time.  No evidence of Ludwick's angina.   Final Clinical Impressions(s) / ED Diagnoses   Final diagnoses:  None    ED Discharge Orders    None       Arthor CaptainHarris, Shayleigh Bouldin, PA-C 01/22/19 2347    Melene PlanFloyd, Dan, DO 01/25/19 1501

## 2019-01-22 NOTE — ED Triage Notes (Signed)
Pt complains of dental pain on his bottom teeth, he states they need to be pulled and was going to do that when the lockdown happened, he is currently taking antibiotics called in from his dentist but the pain is getting worse and flared tonight when he was eating

## 2019-01-22 NOTE — ED Notes (Signed)
Bed: WA07 Expected date:  Expected time:  Means of arrival:  Comments: 

## 2019-01-22 NOTE — Discharge Instructions (Addendum)

## 2021-03-16 ENCOUNTER — Other Ambulatory Visit: Payer: Self-pay

## 2021-03-16 ENCOUNTER — Encounter (HOSPITAL_COMMUNITY): Payer: Self-pay

## 2021-03-16 ENCOUNTER — Emergency Department (HOSPITAL_COMMUNITY)
Admission: EM | Admit: 2021-03-16 | Discharge: 2021-03-16 | Disposition: A | Discharge status: Home / Self Care | Payer: Medicare Other | Attending: Emergency Medicine | Admitting: Emergency Medicine

## 2021-03-16 DIAGNOSIS — M79602 Pain in left arm: Secondary | ICD-10-CM

## 2021-03-16 DIAGNOSIS — Z87891 Personal history of nicotine dependence: Secondary | ICD-10-CM | POA: Insufficient documentation

## 2021-03-16 DIAGNOSIS — Z79899 Other long term (current) drug therapy: Secondary | ICD-10-CM | POA: Diagnosis not present

## 2021-03-16 DIAGNOSIS — J45909 Unspecified asthma, uncomplicated: Secondary | ICD-10-CM | POA: Diagnosis not present

## 2021-03-16 DIAGNOSIS — I10 Essential (primary) hypertension: Secondary | ICD-10-CM | POA: Diagnosis not present

## 2021-03-16 DIAGNOSIS — R202 Paresthesia of skin: Secondary | ICD-10-CM | POA: Insufficient documentation

## 2021-03-16 MED ORDER — PREDNISONE 20 MG PO TABS: 40.0000 mg | ORAL_TABLET | Freq: Every day | ORAL | 0 refills | Status: AC

## 2021-03-16 MED ORDER — KETOROLAC TROMETHAMINE 15 MG/ML IJ SOLN
15.0000 mg | Freq: Once | INTRAMUSCULAR | Status: AC
  Administered 2021-03-16: 15 mg via INTRAMUSCULAR
  Filled 2021-03-16: qty 1

## 2021-03-16 NOTE — ED Provider Notes (Signed)
[facility]-EMERGENCY DEPT Provider Note   CSN: [id number] Arrival date & time: 03/16/21  0865     History Chief Complaint  Patient presents with   Arm Pain    Carl Orozco is a 60 y.o. male.  60 y.o male with a PMH of arthritis, hypertension, asthma presents to the ED with a chief complaint of left arm pain for the past 2 months.  Patient described as constant, gradually worsening, like a numbness sensation to the left biceps radiating down to his left elbow.  Prior history of apical canal stenosis.  Evaluated by PCP a few days ago, prescribed baclofen along with gabapentin without improvement.  Pain is exacerbated with lying on his left side.  Also, describes it as a constant contraction to the left arm.  Without any chest pain, shortness of breath, cough, or other complaints.   The history is provided by the patient and medical records.  Arm Pain This is a new problem. The current episode started more than 1 week ago. The problem occurs constantly. The problem has been gradually worsening. Pertinent negatives include no chest pain and no shortness of breath.      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/30/2008   left lower extremity post lumbar surgery 05-20-2008   Hyperlipidemia    Hypertension    PONV (postoperative nausea and vomiting)    Pre-diabetes    Strabismus     Patient Active Problem List   Diagnosis Date Noted   Right inguinal hernia 09/02/2013   Pure hypercholesterolemia 05/22/2013   HYPERTENSION 06/29/2007   SINUSITIS 06/29/2007   LOW BACK PAIN, CHRONIC 06/29/2007    Past Surgical History:  Procedure Laterality Date   ADJUSTABLE SUTURE MANIPULATION Right 07/11/2018   Procedure: ADJUSTABLE SUTURE MANIPULATION;  Surgeon: [name], MD;  Location: [facility];  Service: Ophthalmology;  Laterality: Right;    INGUINAL HERNIA REPAIR Right 09/17/2013   Procedure: OPEN REPAIR RIGHT INGUINAL HERNIA WITH MESH;  Surgeon: Ernestene Mention, MD;  Location: [facility];  Service: General;  Laterality: Right;   INSERTION OF MESH Right 09/17/2013   Procedure: INSERTION OF MESH;  Surgeon: Ernestene Mention, MD;  Location: [facility];  Service: General;  Laterality: Right;   LUMBAR DISC SURGERY  05-20-2008   dr Alveda Reasons @[facility]    L4-5, L5-S1   STRABISMUS SURGERY Bilateral 07/11/2018   Procedure: REPAIR STRABISMUS;  Surgeon: 07/13/2018, MD;  Location: [facility];  Service: Ophthalmology;  Laterality: Bilateral;       Family History  Problem Relation Age of Onset   Hypertension Mother     Social History   Tobacco Use   Smoking status: Former    Packs/day: 0.50    Years: 10.00    Pack years: 5.00    Types: Cigarettes   Smokeless tobacco: Never   Tobacco comments:    occasional  Vaping Use   Vaping Use: Never used  Substance Use Topics   Alcohol use: No   Drug use: Never    Home Medications Prior to Admission medications   Medication Sig Start Date End Date Taking? Authorizing Provider  predniSONE (DELTASONE) 20 MG tablet Take 2 tablets (40 mg total) by mouth daily for 5 days. 03/16/21 03/21/21 Yes Huntington Leverich, PA-C  albuterol (PROVENTIL HFA;VENTOLIN HFA) 108 (90 BASE) MCG/ACT inhaler Inhale 2  puffs into the lungs every 6 (six) hours as needed for wheezing or shortness of breath. For shortness of breath Patient not taking: Reported on 01/13/2019 05/22/13   Eulis Foster, FNP  baclofen (LIORESAL) 10 MG tablet Take 10 mg by mouth daily as needed for muscle spasms. 11/07/16   [provider]  ezetimibe (ZETIA) 10 MG tablet Take 10 mg by mouth at bedtime.     [provider]  fluticasone (FLONASE) 50 MCG/ACT nasal spray Place 2 sprays into the nose daily as needed for rhinitis or allergies. Patient not taking: Reported on 01/13/2019 05/22/13   Eulis Foster, FNP   HYDROcodone-acetaminophen (NORCO) 5-325 MG tablet Take 1 tablet by mouth every 4 (four) hours as needed. 01/22/19   Arthor Captain, PA-C  simvastatin (ZOCOR) 10 MG tablet Take 1 tablet (10 mg total) by mouth at bedtime. Patient not taking: Reported on 01/13/2019 05/22/13   Worthy Rancher B, FNP  triamcinolone cream (KENALOG) 0.1 % Apply 1 application topically 2 (two) times daily as needed. 01/06/17   [provider]  valsartan-hydrochlorothiazide (DIOVAN-HCT) 320-25 MG per tablet Take 1 tablet by mouth daily with breakfast.  05/29/13   Worthy Rancher B, FNP    Allergies    Aspirin and Fish allergy  Review of Systems   Review of Systems  Constitutional:  Negative for fever.  Respiratory:  Negative for shortness of breath.   Cardiovascular:  Negative for chest pain.  Musculoskeletal:  Positive for arthralgias, myalgias and neck pain. Negative for neck stiffness.   Physical Exam Updated Vital Signs BP 106/68 (BP Location: Right Arm)   Pulse 64   Temp 98.5 F (36.9 C) (Oral)   Resp 16   Ht 5\' 10"  (1.778 m)   Wt 110.7 kg   SpO2 97%   BMI 35.01 kg/m   Physical Exam Vitals and nursing note reviewed.  Constitutional:      Appearance: He is well-developed.  HENT:     Head: Normocephalic and atraumatic.  Eyes:     General: No scleral icterus.    Pupils: Pupils are equal, round, and reactive to light.  Cardiovascular:     Heart sounds: Normal heart sounds.  Pulmonary:     Effort: Pulmonary effort is normal.     Breath sounds: Normal breath sounds. No wheezing.  Chest:     Chest wall: No tenderness.  Abdominal:     General: Bowel sounds are normal. There is no distension.     Palpations: Abdomen is soft.     Tenderness: There is no abdominal tenderness.  Musculoskeletal:        General: No tenderness or deformity.     Right shoulder: Normal. No deformity or bony tenderness. Normal range of motion.     Left shoulder: Normal. No deformity or tenderness. Normal range of  motion.     Cervical back: Normal range of motion.     Comments: XI: bilateral shoulder shrug equal and strong Motor:  5/5 in upper and lower extremities bilaterally including strong and equal grip strength and dorsiflexion/plantar flexion Sensory: light touch normal in all extremities.  Cerebellar: normal finger-to-nose with bilateral upper extremities, pronator drift negative     Skin:    General: Skin is warm and dry.  Neurological:     Mental Status: He is alert and oriented to person, place, and time.    ED Results / Procedures / Treatments   Labs (all labs ordered are listed, but only abnormal results are displayed)  Labs Reviewed - No data to display  EKG None  Radiology No results found.  Procedures Procedures   Medications Ordered in ED Medications  ketorolac (TORADOL) 15 MG/ML injection 15 mg (15 mg Intramuscular Given 03/16/21 1123)    ED Course  I have reviewed the triage vital signs and the nursing notes.  Pertinent labs & imaging results that were available during my care of the patient were reviewed by me and considered in my medical decision making (see chart for details).    MDM Rules/Calculators/A&P  Patient presents to the ED with a chief complaint of left arm pain that has been ongoing for the past 2 months.  Described as a contracting sensation to his left bicep radiating down his left arm, especially worse when attempting to flex his left elbow.  Seen by PCP, prescribed baclofen along with gabapentin without much improvement in his symptoms.  During evaluation he is overall nontoxic, non-ill-appearing.  Afebrile, vitals are within normal limits, has full range of motion of the left shoulder, left elbow, left wrist.  Pulses are present, sensation is intact throughout.  There is some pain with lifting of his left shoulder over his head.  Since of chart review, review is an MRI from 2018, which was significant for cervical spine stenosis of C3, C4 along  with C5 and C6.  Some suspicion for progression of this, he did not follow-up with any neurosurgeon back then.  He is questing a cortisone injection to his left shoulder, he was discussed that we do not do this procedure in the ER.  However we could provide him with a shot of Toradol, along with send him home on steroids.  No prior history of diabetes, does states borderline diabetes, we discussed risks and benefits of placing him on steroid therapy.  Portions of this note were generated with Scientist, clinical (histocompatibility and immunogenetics). Dictation errors may occur despite best attempts at proofreading.  Final Clinical Impression(s) / ED Diagnoses Final diagnoses:  Left arm pain    Rx / DC Orders ED Discharge Orders          Ordered    predniSONE (DELTASONE) 20 MG tablet  Daily        03/16/21 1035             Claude Manges, PA-C 03/16/21 1141    Gerhard Munch, MD 03/16/21 1538

## 2021-03-16 NOTE — ED Triage Notes (Signed)
Patient c/o left arm pain x 1 1/2 months. Patient states he has numbness, tightness,and squeezing. Patient states pain is worse when he bends his left arm or sleeps on the left side. Patient states that he has to shake his arm to get the pain to decrease.

## 2021-03-16 NOTE — Discharge Instructions (Addendum)
I have prescribed steroids, please take 2 tablets daily for the next 5 days. This medication can cause insomnia, appetite changes.   A referral to neurosurgery on-call has been attached to your chart, please schedule an appointment for further evaluation of your recurrent left arm pain.  X-rays any chest pain, shortness of breath, worsening symptoms you will need to return to the emergency department for

## 2021-03-25 LAB — COLOGUARD: COLOGUARD: NEGATIVE

## 2021-05-11 ENCOUNTER — Other Ambulatory Visit: Payer: Self-pay | Admitting: Urology

## 2021-05-11 DIAGNOSIS — R972 Elevated prostate specific antigen [PSA]: Secondary | ICD-10-CM

## 2021-05-28 ENCOUNTER — Other Ambulatory Visit: Payer: Self-pay | Admitting: Orthopedic Surgery

## 2021-05-28 ENCOUNTER — Other Ambulatory Visit (HOSPITAL_COMMUNITY): Payer: Self-pay | Admitting: Orthopedic Surgery

## 2021-05-28 DIAGNOSIS — M25512 Pain in left shoulder: Secondary | ICD-10-CM

## 2021-06-01 ENCOUNTER — Ambulatory Visit (HOSPITAL_COMMUNITY): Admission: RE | Admit: 2021-06-01 | Payer: Medicare Other | Source: Ambulatory Visit

## 2021-06-07 ENCOUNTER — Other Ambulatory Visit: Payer: Medicare Other

## 2021-06-14 ENCOUNTER — Ambulatory Visit (HOSPITAL_COMMUNITY): Admission: RE | Admit: 2021-06-14 | Payer: Medicare Other | Source: Ambulatory Visit

## 2021-06-17 ENCOUNTER — Ambulatory Visit
Admission: RE | Admit: 2021-06-17 | Discharge: 2021-06-17 | Disposition: A | Discharge status: Home / Self Care | Payer: Medicare Other | Source: Ambulatory Visit | Attending: Urology | Admitting: Urology

## 2021-06-17 DIAGNOSIS — R972 Elevated prostate specific antigen [PSA]: Secondary | ICD-10-CM

## 2021-06-17 MED ORDER — GADOBENATE DIMEGLUMINE 529 MG/ML IV SOLN
20.0000 mL | Freq: Once | INTRAVENOUS | Status: AC | PRN
  Administered 2021-06-17: 20 mL via INTRAVENOUS

## 2021-06-21 ENCOUNTER — Encounter (HOSPITAL_COMMUNITY): Payer: Self-pay

## 2021-06-21 ENCOUNTER — Ambulatory Visit (HOSPITAL_COMMUNITY): Admission: RE | Admit: 2021-06-21 | Payer: Medicare Other | Source: Ambulatory Visit

## 2021-10-21 ENCOUNTER — Emergency Department (HOSPITAL_COMMUNITY)
Admission: EM | Admit: 2021-10-21 | Discharge: 2021-10-21 | Disposition: A | Discharge status: Home / Self Care | Payer: Medicare Other | Attending: Emergency Medicine | Admitting: Emergency Medicine

## 2021-10-21 ENCOUNTER — Other Ambulatory Visit: Payer: Self-pay

## 2021-10-21 ENCOUNTER — Encounter (HOSPITAL_COMMUNITY): Payer: Self-pay | Admitting: Emergency Medicine

## 2021-10-21 DIAGNOSIS — R0981 Nasal congestion: Secondary | ICD-10-CM | POA: Diagnosis present

## 2021-10-21 DIAGNOSIS — Z8616 Personal history of COVID-19: Secondary | ICD-10-CM | POA: Insufficient documentation

## 2021-10-21 DIAGNOSIS — J0100 Acute maxillary sinusitis, unspecified: Secondary | ICD-10-CM | POA: Insufficient documentation

## 2021-10-21 DIAGNOSIS — I10 Essential (primary) hypertension: Secondary | ICD-10-CM | POA: Insufficient documentation

## 2021-10-21 MED ORDER — AMOXICILLIN-POT CLAVULANATE 875-125 MG PO TABS: 1.0000 | ORAL_TABLET | Freq: Two times a day (BID) | ORAL | 0 refills | Status: AC

## 2021-10-21 NOTE — ED Provider Notes (Signed)
[facility]-EMERGENCY [facility]PT Provider Note   CSN: [id number] Arrival date & time: 10/21/21  9833     History  Chief Complaint  Patient presents with   Nasal Congestion    Carl Orozco is a 61 y.o. male.  61 y.o male with a PMH of HTN presents to the ED with a chief complaint of sinus ingestion, head cold for the past 3 weeks.  Patient reports previously having influenza approximately 2 months ago, was seen by PCP, was placed on amoxicillin which he completed.  After this medication, he noted some improvement in symptoms, however they rebounded resume 1 week after.  He continues to have green, yellow mucus when blowing his nose, significant maxillary sinus pressure.  Has been doing Flonase, Afrin, Zyrtec's without much improvement in his symptoms.  Gums are exacerbated at night, especially when lying flat he has not been able to get any rest.  He has been testing for COVID-19 at home with results being negative.  He denies any fever, cough, chest pain, shortness of breath.  The history is provided by the patient.      Home Medications Prior to Admission medications   Medication Sig Start Date End Date Taking? Authorizing Provider  amoxicillin-clavulanate (AUGMENTIN) 875-125 MG tablet Take 1 tablet by mouth 2 (two) times daily for 7 days. 10/21/21 10/28/21 Yes Deshondra Worst, PA-C  albuterol (PROVENTIL HFA;VENTOLIN HFA) 108 (90 BASE) MCG/ACT inhaler Inhale 2 puffs into the lungs every 6 (six) hours as needed for wheezing or shortness of breath. For shortness of breath Patient not taking: Reported on 01/13/2019 05/22/13   Eulis Foster, FNP  baclofen (LIORESAL) 10 MG tablet Take 10 mg by mouth daily as needed for muscle spasms. 11/07/16   [provider]  ezetimibe (ZETIA) 10 MG tablet Take 10 mg by mouth at bedtime.     [provider]  fluticasone (FLONASE) 50 MCG/ACT nasal spray Place 2 sprays into the nose daily as needed for rhinitis or  allergies. Patient not taking: Reported on 01/13/2019 05/22/13   Eulis Foster, FNP  HYDROcodone-acetaminophen (NORCO) 5-325 MG tablet Take 1 tablet by mouth every 4 (four) hours as needed. 01/22/19   Arthor Captain, PA-C  simvastatin (ZOCOR) 10 MG tablet Take 1 tablet (10 mg total) by mouth at bedtime. Patient not taking: Reported on 01/13/2019 05/22/13   Worthy Rancher B, FNP  triamcinolone cream (KENALOG) 0.1 % Apply 1 application topically 2 (two) times daily as needed. 01/06/17   [provider]  valsartan-hydrochlorothiazide (DIOVAN-HCT) 320-25 MG per tablet Take 1 tablet by mouth daily with breakfast.  05/29/13   Worthy Rancher B, FNP      Allergies    Aspirin and Fish allergy    Review of Systems   Review of Systems  Constitutional:  Negative for chills and fever.  HENT:  Positive for sinus pressure.   Respiratory:  Negative for shortness of breath.    Physical Exam Updated Vital Signs BP 109/67 (BP Location: Left Arm)    Pulse 66    Temp 98.1 F (36.7 C) (Oral)    Resp 20    SpO2 98%  Physical Exam Vitals and nursing note reviewed.  Constitutional:      Appearance: Normal appearance.  HENT:     Head: Normocephalic and atraumatic.     Nose: Nasal tenderness and congestion present.     Right Nostril: No epistaxis.     Left Nostril: No epistaxis.     Right Turbinates:  Enlarged.     Left Turbinates: Enlarged.     Right Sinus: Maxillary sinus tenderness and frontal sinus tenderness present.     Left Sinus: Maxillary sinus tenderness and frontal sinus tenderness present.     Mouth/Throat:     Mouth: Mucous membranes are moist.  Cardiovascular:     Rate and Rhythm: Normal rate.  Abdominal:     General: Abdomen is flat.     Tenderness: There is no abdominal tenderness.  Skin:    General: Skin is warm and dry.  Neurological:     Mental Status: He is alert and oriented to person, place, and time.    ED Results / Procedures / Treatments   Labs (all labs ordered are  listed, but only abnormal results are displayed) Labs Reviewed - No data to display  EKG None  Radiology No results found.  Procedures Procedures    Medications Ordered in ED Medications - No data to display  ED Course/ Medical Decision Making/ A&P                           Medical Decision Making    Patient presents to the ED with a chief complaint of nasal congestion that is been ongoing for the past 3 weeks.  Previously treated with amoxicillin by PCP after a post influenza infection.  Reports improvement in symptoms for 1 week and these later have rebounded.  Has failed outpatient Flonase, Afrin, Zyrtec.  Their primary evaluation patient is nasally congested, there is significant pain along the maxillary sinuses with palpation.  There is some pain along the frontal sinuses.  Minutes are enlarged, oropharynx is clear without any erythema, or exudates.  Discussed priorly being on omentum which actually worked to improve his symptoms.  Vitals are within normal limits.  We did discuss placing him on Augmentin as he was recently on antibiotics, will trial a 7-day course, we did discuss follow-up with PCP after this occurs.  Patient agrees understands management, patient stable for discharge.   Portions of this note were generated with Scientist, clinical (histocompatibility and immunogenetics). Dictation errors may occur despite best attempts at proofreading.   Final Clinical Impression(s) / ED Diagnoses Final diagnoses:  Acute maxillary sinusitis, recurrence not specified    Rx / DC Orders ED Discharge Orders          Ordered    amoxicillin-clavulanate (AUGMENTIN) 875-125 MG tablet  2 times daily        10/21/21 0815              Claude Manges, PA-C 10/21/21 0831    Mancel Bale, MD 10/21/21 1511

## 2021-10-21 NOTE — Discharge Instructions (Addendum)
I have prescribed medication in order to help treat your sinusitis.  Please take 1 tablet twice a day for the next 7 days.  You will need to make an appointment with your primary care physician after completion of antibiotics to obtain a reevaluation.  You may continue the use of Flonase, we did discuss use of Afrin.

## 2021-10-21 NOTE — ED Triage Notes (Signed)
Pt reports nasal congestion and head cold x3weeks. Yellow, green mucus noted when blowing nose. OTC meds taken with no relief.

## 2021-11-15 ENCOUNTER — Emergency Department (HOSPITAL_COMMUNITY)
Admission: EM | Admit: 2021-11-15 | Discharge: 2021-11-15 | Disposition: A | Discharge status: Home / Self Care | Payer: Medicare Other | Attending: Emergency Medicine | Admitting: Emergency Medicine

## 2021-11-15 ENCOUNTER — Encounter (HOSPITAL_COMMUNITY): Payer: Self-pay

## 2021-11-15 DIAGNOSIS — R0981 Nasal congestion: Secondary | ICD-10-CM | POA: Diagnosis present

## 2021-11-15 DIAGNOSIS — J0100 Acute maxillary sinusitis, unspecified: Secondary | ICD-10-CM | POA: Insufficient documentation

## 2021-11-15 MED ORDER — LEVOFLOXACIN 500 MG PO TABS: 500.0000 mg | ORAL_TABLET | Freq: Every day | ORAL | 0 refills | Status: DC

## 2021-11-15 MED ORDER — LEVOFLOXACIN 250 MG PO TABS: 250.0000 mg | ORAL_TABLET | Freq: Every day | ORAL | 0 refills | Status: DC

## 2021-11-15 NOTE — Discharge Instructions (Addendum)
I have sent you in a prescription for an antibiotic called Levaquin which you can take for one week for your symptoms. Continue taking the mucinex and flonase as well. If using mucinex spray, alternate the flonase and mucinex in order to have a medication on board at all times. If using the mucinex pill, you can take the pill and the flonase at the same time.  ? ?If this does not improve, please follow up with provided ENT referal above. ?

## 2021-11-15 NOTE — ED Provider Notes (Signed)
?[facility] DEPT ?Provider Note ? ? ?CSN: [id number] ?Arrival date & time: 11/15/21  1135 ? ?  ? ?History ? ?Chief Complaint  ?Patient presents with  ? Nasal Congestion  ? ? ?Carl Orozco is a 61 y.o. male who presents to the ED for evaluation of nasal congestion x2 months.  He has been taking over-the-counter medications with intermittent relief, however he was concerned this morning when he woke up and his nose was entirely clogged making it difficult for him to breathe.  Patient was seen here in the emergency department 1 month ago for similar symptoms and was prescribed a course of Augmentin which temporarily improved his symptoms, however they resumed again after a few days.  Patient at this point has attempted using Flonase, Afrin, Zyrtec and now Augmentin without any improvement.  Symptoms are worse when lying flat at night.  Negative home COVID test.  He denies abdominal pain, nausea, vomiting, chest pain, shortness of breath. ? ?HPI ? ?  ? ?Home Medications ?Prior to Admission medications   ?Medication Sig Start Date End Date Taking? Authorizing Provider  ?levofloxacin (LEVAQUIN) 250 MG tablet Take 1 tablet (250 mg total) by mouth daily. Take 1 tablet (250mg ) plus 1 tablet of 500mg  (total 750mg ) by mouth once daily 11/15/21  Yes Kathe Becton R, PA-C  ?levofloxacin (LEVAQUIN) 500 MG tablet Take 1 tablet (500 mg total) by mouth daily. Take 1 tablet (500mg ) plus 1 tablet of 250mg  (total 750mg ) by mouth once daily 11/15/21  Yes Kathe Becton R, PA-C  ?albuterol (PROVENTIL HFA;VENTOLIN HFA) 108 (90 BASE) MCG/ACT inhaler Inhale 2 puffs into the lungs every 6 (six) hours as needed for wheezing or shortness of breath. For shortness of breath ?Patient not taking: Reported on 01/13/2019 05/22/13   Dutch Quint B, FNP  ?baclofen (LIORESAL) 10 MG tablet Take 10 mg by mouth daily as needed for muscle spasms. 11/07/16   [provider]  ?ezetimibe (ZETIA) 10 MG tablet Take 10 mg  by mouth at bedtime.     [provider]  ?fluticasone (FLONASE) 50 MCG/ACT nasal spray Place 2 sprays into the nose daily as needed for rhinitis or allergies. ?Patient not taking: Reported on 01/13/2019 05/22/13   Kennyth Arnold, FNP  ?HYDROcodone-acetaminophen (NORCO) 5-325 MG tablet Take 1 tablet by mouth every 4 (four) hours as needed. 01/22/19   Margarita Mail, PA-C  ?simvastatin (ZOCOR) 10 MG tablet Take 1 tablet (10 mg total) by mouth at bedtime. ?Patient not taking: Reported on 01/13/2019 05/22/13   Kennyth Arnold, FNP  ?triamcinolone cream (KENALOG) 0.1 % Apply 1 application topically 2 (two) times daily as needed. 01/06/17   [provider]  ?valsartan-hydrochlorothiazide (DIOVAN-HCT) 320-25 MG per tablet Take 1 tablet by mouth daily with breakfast.  05/29/13   Kennyth Arnold, FNP  ?   ? ?Allergies    ?Aspirin and Fish allergy   ? ?Review of Systems   ?Review of Systems ? ?Physical Exam ?Updated Vital Signs ?BP 133/71 (BP Location: Left Arm)   Pulse 72   Temp 98 ?F (36.7 ?C) (Oral)   Resp 18   SpO2 98%  ?Physical Exam ?Vitals and nursing note reviewed.  ?Constitutional:   ?   General: He is not in acute distress. ?   Appearance: He is not ill-appearing.  ?HENT:  ?   Head: Atraumatic.  ?   Nose: Congestion present.  ?   Right Turbinates: Enlarged.  ?   Left Turbinates: Not enlarged.  ?  Right Sinus: Maxillary sinus tenderness present.  ?   Left Sinus: Maxillary sinus tenderness present.  ?   Mouth/Throat:  ?   Pharynx: Oropharynx is clear. Uvula midline. No oropharyngeal exudate or posterior oropharyngeal erythema.  ?Eyes:  ?   Conjunctiva/sclera: Conjunctivae normal.  ?Cardiovascular:  ?   Rate and Rhythm: Normal rate and regular rhythm.  ?   Pulses: Normal pulses.  ?   Heart sounds: No murmur heard. ?Pulmonary:  ?   Effort: Pulmonary effort is normal. No respiratory distress.  ?   Breath sounds: Normal breath sounds.  ?Abdominal:  ?   General: Abdomen is flat. There is no distension.   ?   Palpations: Abdomen is soft.  ?   Tenderness: There is no abdominal tenderness.  ?Musculoskeletal:     ?   General: Normal range of motion.  ?   Cervical back: Normal range of motion.  ?Skin: ?   General: Skin is warm and dry.  ?   Capillary Refill: Capillary refill takes less than 2 seconds.  ?Neurological:  ?   General: No focal deficit present.  ?   Mental Status: He is alert.  ?Psychiatric:     ?   Mood and Affect: Mood normal.  ? ? ?ED Results / Procedures / Treatments   ?Labs ?(all labs ordered are listed, but only abnormal results are displayed) ?Labs Reviewed - No data to display ? ?EKG ?None ? ?Radiology ?No results found. ? ?Procedures ?Procedures  ? ? ?Medications Ordered in ED ?Medications - No data to display ? ?ED Course/ Medical Decision Making/ A&P ?  ?                        ?Medical Decision Making ?Risk ?Prescription drug management. ? ? ?History:  ?Per HPI ?Social determinants of health: None ? ?Initial impression: ? ?This patient presents to the ED for concern of nasal congestion x2 months to, this involves an extensive number of treatment options, and is a complaint that carries with it a high risk of complications and morbidity.    ? ? ?ED Course: ?61 year old man with subacute nasal congestion for 2 months that has failed several over-the-counter and prescription treatments.  Overall, he is well-appearing, nontoxic.  His vitals are normal, he is afebrile.  I had a long discussion with patient about at home therapies including Mucinex and Flonase for his congestion.  Additionally I have prescribed him a course of Levaquin in hopes to clear his symptoms.  I also stressed to him that if he fails this next course of antibiotics, that he will need to see an ENT or his PCP for further evaluation and treatment.  Patient expresses understanding. ? ?Disposition: ? ?After consideration of the diagnostic results, physical exam, history and the patients response to treatment feel that the patent  would benefit from discharge.   ?Subacute maxillary sinusitis: Levaquin prescribed.  All questions were asked and answered.  ENT referral provided.  Patient was discharged home in good condition. ? ? ?Final Clinical Impression(s) / ED Diagnoses ?Final diagnoses:  ?Subacute maxillary sinusitis  ? ? ?Rx / DC Orders ?ED Discharge Orders   ? ?      Ordered  ?  levofloxacin (LEVAQUIN) 500 MG tablet  Daily       ? 11/15/21 1233  ?  levofloxacin (LEVAQUIN) 250 MG tablet  Daily       ? 11/15/21 1233  ? ?  ?  ? ?  ? ? ?  ?  Tonye Pearson, [place] ?11/16/21 2017 ? ?  ?Regan Lemming, MD ?11/16/21 2047 ? ?

## 2021-11-15 NOTE — ED Triage Notes (Signed)
Pt arrived via POV c/o nasal congestions x2 months. States he has been taking OTC meds with some relief. This morning, states one nostril was stuffed and one was clear after meds.  ?

## 2022-05-25 ENCOUNTER — Other Ambulatory Visit: Payer: Self-pay | Admitting: Orthopedic Surgery

## 2022-05-25 DIAGNOSIS — M25512 Pain in left shoulder: Secondary | ICD-10-CM

## 2023-12-08 LAB — COLOGUARD: COLOGUARD: NEGATIVE

## 2024-01-01 ENCOUNTER — Ambulatory Visit: Admitting: Allergy & Immunology

## 2024-02-07 ENCOUNTER — Encounter: Payer: Self-pay | Admitting: Allergy

## 2024-02-07 ENCOUNTER — Ambulatory Visit (INDEPENDENT_AMBULATORY_CARE_PROVIDER_SITE_OTHER): Admitting: Allergy

## 2024-02-07 VITALS — BP 106/68 | HR 69 | Temp 97.3°F | Resp 16 | Ht 70.0 in | Wt 244.0 lb

## 2024-02-07 DIAGNOSIS — J329 Chronic sinusitis, unspecified: Secondary | ICD-10-CM

## 2024-02-07 DIAGNOSIS — T7800XD Anaphylactic reaction due to unspecified food, subsequent encounter: Secondary | ICD-10-CM | POA: Diagnosis not present

## 2024-02-07 DIAGNOSIS — J4520 Mild intermittent asthma, uncomplicated: Secondary | ICD-10-CM

## 2024-02-07 DIAGNOSIS — L2089 Other atopic dermatitis: Secondary | ICD-10-CM | POA: Diagnosis not present

## 2024-02-07 MED ORDER — TRIAMCINOLONE ACETONIDE 0.1 % EX OINT
TOPICAL_OINTMENT | CUTANEOUS | 3 refills | Status: AC
Start: 2024-02-07 — End: ?

## 2024-02-07 MED ORDER — RYALTRIS 665-25 MCG/ACT NA SUSP: 2.0000 | Freq: Two times a day (BID) | NASAL | 5 refills | Status: AC

## 2024-02-07 MED ORDER — EPINEPHRINE 0.3 MG/0.3ML IJ SOAJ: 0.3000 mg | INTRAMUSCULAR | 1 refills | Status: AC | PRN

## 2024-02-07 MED ORDER — ALBUTEROL SULFATE HFA 108 (90 BASE) MCG/ACT IN AERS: 2.0000 | INHALATION_SPRAY | RESPIRATORY_TRACT | 2 refills | Status: AC | PRN

## 2024-02-07 NOTE — Progress Notes (Signed)
 New Patient Note  RE: Carl Orozco MRN: [id number] DOB: 21-Jan-1961 Date of Office Visit: 02/07/2024  Primary care provider: none at this time  Chief Complaint: allergies  History of present illness: Carl Orozco is a 63 y.o. male presenting today for evaluation of chronic rhinitis.  Discussed the use of AI scribe software for clinical note transcription with the patient, who gave verbal consent to proceed.  He has experienced persistent nasal congestion, allergy symptoms, and sinus infections for the past three to four years. His nose is constantly obstructed, causing difficulty breathing, especially while eating, necessitating deep breaths. These symptoms persist throughout the year without seasonal variation.  Allergy medications like Zyrtec and Claritin help somewhat during pollen season. No itchy, watery eyes, ear symptoms, or fever, but occasional throat clearing and difficulty swallowing, attributed to nasal congestion.   His current regimen includes Flonase  and Mucinex , which provide only temporary relief. He previously tried Afrin and saline sprays without significant improvement. He has not consulted an ENT since 2023, who noted a deviated septum at that time.   He experiences sinus infections two to three times a year, marked by changes in mucus color and thickness, and pressure across the bridge of his nose and forehead. No fever is present, but Augmentin  effectively treats these infections. Prednisone  has also provided some relief in the past.  He has a history of asthma, which was severe in childhood but has improved with age. He uses an albuterol  inhaler as needed, approximately three to four times a month, particularly during respiratory symptoms associated with illness.  He has eczema, managed with triamcinolone  ointment, providing significant relief.  He is allergic to fish, causing a rash similar looking to chicken pox, identified in childhood, but can tolerate  shellfish.    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   left lower extremity post lumbar surgery 05-20-2008   Hyperlipidemia    Hypertension    PONV (postoperative nausea and vomiting)    Pre-diabetes    Strabismus     Past surgical history: Past Surgical History:  Procedure Laterality Date   ADJUSTABLE SUTURE MANIPULATION Right 07/11/2018   Procedure: ADJUSTABLE SUTURE MANIPULATION;  Surgeon: Lorena Rolling, MD;  Location[facility];  Service: Ophthalmology;  Laterality: Right;   INGUINAL HERNIA REPAIR Right 09/17/2013   Procedure: OPEN REPAIR RIGHT INGUINAL HERNIA WITH MESH;  Surgeon: Levert Ready, MD;  Location: [facility];  Service: General;  Laterality: Right;   INSERTION OF MESH Right 09/17/2013   Procedure: INSERTION OF MESH;  Surgeon: Levert Ready, MD;  Location: [facility];  Service: General;  Laterality: Right;   LUMBAR DISC SURGERY  05-20-2008   dr Bynum Cassis @[facility]    L4-5, L5-S1   STRABISMUS SURGERY Bilateral 07/11/2018   Procedure: REPAIR STRABISMUS;  Surgeon: Lorena Rolling, MD;  Location: [facility];  Service: Ophthalmology;  Laterality: Bilateral;    Family history:  Family History  Problem Relation Age of Onset   Hypertension Mother    Allergic rhinitis Sister    Sinusitis Sister     Social history: Lives in a condo without carpeting with electric heating and central cooling.  No pets in the home.  Neighbors have dogs.  No concern for water damage, mildew  or roaches in the home.  He is retired/disabled.  Tobacco Use   Smoking status: Former    Current packs/day: 0.50    Average packs/day: 0.5 packs/day for 10.0 years (5.0 ttl pk-yrs)    Types: Cigarettes   Smokeless tobacco: Never   Tobacco comments:    occasional  Vaping Use    Vaping status: Never Used     Medication List: Current Outpatient Medications  Medication Sig Dispense Refill   ALPRAZolam (XANAX) 0.25 MG tablet Take 0.25 mg by mouth 2 (two) times daily as needed.     cyclobenzaprine  (FLEXERIL ) 10 MG tablet Take 10 mg by mouth 3 (three) times daily as needed.     EPINEPHrine  (EPIPEN  2-PAK) 0.3 mg/0.3 mL IJ SOAJ injection Inject 0.3 mg into the muscle as needed. 2 each 1   ezetimibe  (ZETIA ) 10 MG tablet Take 10 mg by mouth at bedtime.      fluticasone  (FLONASE ) 50 MCG/ACT nasal spray Place 2 sprays into the nose daily as needed for rhinitis or allergies. 16 g 5   gabapentin (NEURONTIN) 100 MG capsule TAKE 3 CAPSULES BY MOUTH 3 TIMES A DAY     metFORMIN (GLUCOPHAGE-XR) 500 MG 24 hr tablet Take 1 tablet by mouth 2 (two) times daily.     Olopatadine-Mometasone (RYALTRIS) 665-25 MCG/ACT SUSP Place 2 sprays into both nostrils in the morning and at bedtime. 29 g 5   oxyCODONE -acetaminophen  (PERCOCET) 10-325 MG tablet Take 1 tablet by mouth every 4 (four) hours as needed for pain.     simvastatin  (ZOCOR ) 10 MG tablet Take 1 tablet (10 mg total) by mouth at bedtime. 30 tablet 5   tamsulosin (FLOMAX) 0.4 MG CAPS capsule Take 1 capsule by mouth daily.     triamcinolone  ointment (KENALOG ) 0.1 % Apply sparingly to affected areas twice daily as needed below face and neck 60 g 3   valsartan  (DIOVAN ) 320 MG tablet Take 320 mg by mouth daily.     albuterol  (VENTOLIN  HFA) 108 (90 Base) MCG/ACT inhaler Inhale 2 puffs into the lungs every 4 (four) hours as needed for wheezing or shortness of breath. 1 each 2   Vitamin D , Ergocalciferol , (DRISDOL) 1.25 MG (50000 UNIT) CAPS capsule Take 50,000 Units by mouth once a week.     No current facility-administered medications for this visit.    Known medication allergies: Allergies  Allergen Reactions   Aspirin Hives   Fish Allergy Hives     Physical examination: Blood pressure 106/68, pulse 69, temperature (!) 97.3 F  (36.3 C), temperature source Temporal, resp. rate 16, height 5' 10 (1.778 m), weight 244 lb (110.7 kg), SpO2 96%.  General: Alert, interactive, in no acute distress. HEENT: PERRLA, TMs pearly gray, turbinates moderately edematous without discharge, post-pharynx non erythematous. Neck: Supple without lymphadenopathy. Lungs: Clear to auscultation without wheezing, rhonchi or rales. {no increased work of breathing. CV: Normal S1, S2 without murmurs. Abdomen: Nondistended, nontender. Skin: Warm and dry, without lesions or rashes. Extremities:  No clubbing, cyanosis or edema. Neuro:   Grossly intact.  Diagnostics/Labs:  Spirometry: FEV1: 2.83L 95%, FVC: 3.1L 81%, ratio consistent with nonobstructive pattern  Assessment and plan:   Chronic rhinosinusitis Chronic nasal congestion and sinusitis for 3-4 years, persistent year-round.  ENT evaluation revealed a deviated septum and mucus crusting.  - Schedule allergy skin testing to identify environmental allergens.  Hold anithistamines for 3 days prior to testing.  - Prescribe Ryaltris nasal spray.  This is a combination nasal spray with mometasone (steroid)  for congestion control and olopatadine (antihistamine) for drainage control.   Use 2 sprays each nostril twice a day at this time. With using nasal sprays point tip of bottle toward eye on same side nostril and lean head slightly forward for best technique.   - Provided nasal rinse kit, instruct on use with distilled or boiled water.  Keep mouth open during entirety of the rinse.  - Discuss potential for allergy shots if allergic to environmental factors.  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 No issues with shellfish. Plan to test for fish allergy during allergy testing to assess current severity. - Include fish in allergy testing to assess current severity. - Continue avoidance of fish - Have access to self-injectable epinephrine  (Epipen  or AuviQ) 0.3mg  at  all times - Follow emergency action plan in case of allergic reaction  Asthma Asthma less severe than in childhood. Symptoms triggered by illness or cold. Uses albuterol  inhaler as needed, 3-4 times a month. Breathing tests normal. - Have access to albuterol  inhaler 2 puffs every 4-6 hours as needed for cough/wheeze/shortness of breath/chest tightness.  May use 15-20 minutes prior to activity.   Monitor frequency of use.    Eczema Eczema managed with triamcinolone  ointment, effectively controlling symptoms. - Refill triamcinolone  0.1% ointment for eczema management.  Schedule skin testing visit.  Hold antihistamines for 3 days prior.  (Env 1-55 + fish panel) Routine follow-up in 3-4 months or sooner if needed  I appreciate the opportunity to take part in Oran's care. Please do not hesitate to contact me with questions.  Sincerely,   Catha Clink, MD Allergy/Immunology [facility]

## 2024-02-07 NOTE — Patient Instructions (Addendum)
 Chronic nasal congestion and sinusitis Chronic nasal congestion and sinusitis for 3-4 years, persistent year-round.  ENT evaluation revealed a deviated septum and mucus crusting.  - Schedule allergy skin testing to identify environmental allergens.  Hold anithistamines for 3 days prior to testing.  - Prescribe Ryaltris nasal spray.  This is a combination nasal spray with mometasone (steroid) for congestion control and olopatadine (antihistamine) for drainage control.   Use 2 sprays each nostril twice a day at this time. With using nasal sprays point tip of bottle toward eye on same side nostril and lean head slightly forward for best technique.   - Provided nasal rinse kit, instruct on use with distilled or boiled water.  Keep mouth open during entirety of the rinse.  - Discuss potential for allergy shots if allergic to environmental factors.  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 No issues with shellfish. Plan to test for fish allergy during allergy testing to assess current severity. - Include fish in allergy testing to assess current severity. - Continue avoidance of fish - Have access to self-injectable epinephrine  (Epipen  or AuviQ) 0.3mg  at all times - Follow emergency action plan in case of allergic reaction  Asthma Asthma less severe than in childhood. Symptoms triggered by illness or cold. Uses albuterol  inhaler as needed, 3-4 times a month. Breathing tests normal. - Have access to albuterol  inhaler 2 puffs every 4-6 hours as needed for cough/wheeze/shortness of breath/chest tightness.  May use 15-20 minutes prior to activity.   Monitor frequency of use.    Eczema Eczema managed with triamcinolone  ointment, effectively controlling symptoms. - Refill triamcinolone  0.1% ointment for eczema management.  Schedule skin testing visit.  Hold antihistamines for 3 days prior.  Routine follow-up in 3-4 months or sooner if needed

## 2024-03-07 ENCOUNTER — Ambulatory Visit: Admitting: Allergy

## 2024-06-12 ENCOUNTER — Ambulatory Visit: Admitting: Allergy

## 2024-07-03 ENCOUNTER — Other Ambulatory Visit: Payer: Self-pay

## 2024-07-03 DIAGNOSIS — J329 Chronic sinusitis, unspecified: Secondary | ICD-10-CM

## 2024-07-03 DIAGNOSIS — J342 Deviated nasal septum: Secondary | ICD-10-CM

## 2024-07-09 ENCOUNTER — Other Ambulatory Visit: Payer: Self-pay

## 2024-07-09 DIAGNOSIS — J309 Allergic rhinitis, unspecified: Secondary | ICD-10-CM

## 2024-07-09 DIAGNOSIS — J342 Deviated nasal septum: Secondary | ICD-10-CM

## 2024-07-09 DIAGNOSIS — J329 Chronic sinusitis, unspecified: Secondary | ICD-10-CM

## 2024-07-15 ENCOUNTER — Other Ambulatory Visit

## 2024-08-14 ENCOUNTER — Inpatient Hospital Stay: Admission: RE | Admit: 2024-08-14 | Discharge: 2024-08-14

## 2024-08-14 DIAGNOSIS — J329 Chronic sinusitis, unspecified: Secondary | ICD-10-CM

## 2024-08-14 DIAGNOSIS — J342 Deviated nasal septum: Secondary | ICD-10-CM

## 2024-08-14 DIAGNOSIS — J309 Allergic rhinitis, unspecified: Secondary | ICD-10-CM
